# Patient Record
Sex: Female | Born: 1960 | Race: White | Hispanic: No | Marital: Married | Smoking: Never smoker
Health system: Southern US, Community
[De-identification: ages and names within clinical notes are randomized; demographics above are authoritative.]

## PROBLEM LIST (undated history)

## (undated) DIAGNOSIS — R000 Tachycardia, unspecified: Secondary | ICD-10-CM

## (undated) DIAGNOSIS — M8580 Other specified disorders of bone density and structure, unspecified site: Secondary | ICD-10-CM

## (undated) DIAGNOSIS — J302 Other seasonal allergic rhinitis: Secondary | ICD-10-CM

## (undated) DIAGNOSIS — C4491 Basal cell carcinoma of skin, unspecified: Secondary | ICD-10-CM

## (undated) DIAGNOSIS — G43009 Migraine without aura, not intractable, without status migrainosus: Secondary | ICD-10-CM

## (undated) HISTORY — DX: Other seasonal allergic rhinitis: J30.2

## (undated) HISTORY — DX: Basal cell carcinoma of skin, unspecified: C44.91

## (undated) HISTORY — PX: WISDOM TOOTH EXTRACTION: SHX21

## (undated) HISTORY — DX: Other specified disorders of bone density and structure, unspecified site: M85.80

## (undated) HISTORY — DX: Migraine without aura, not intractable, without status migrainosus: G43.009

## (undated) HISTORY — DX: Tachycardia, unspecified: R00.0

---
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
## 1998-06-24 DIAGNOSIS — R000 Tachycardia, unspecified: Secondary | ICD-10-CM

## 1998-06-24 HISTORY — DX: Tachycardia, unspecified: R00.0

## 1998-08-18 ENCOUNTER — Other Ambulatory Visit: Admission: RE | Admit: 1998-08-18 | Discharge: 1998-08-18 | Payer: Self-pay | Admitting: Obstetrics and Gynecology

## 2000-05-20 ENCOUNTER — Other Ambulatory Visit: Admission: RE | Admit: 2000-05-20 | Discharge: 2000-05-20 | Payer: Self-pay | Admitting: Obstetrics and Gynecology

## 2002-03-26 ENCOUNTER — Other Ambulatory Visit: Admission: RE | Admit: 2002-03-26 | Discharge: 2002-03-26 | Payer: Self-pay | Admitting: Obstetrics and Gynecology

## 2003-12-06 ENCOUNTER — Other Ambulatory Visit: Admission: RE | Admit: 2003-12-06 | Discharge: 2003-12-06 | Payer: Self-pay | Admitting: Obstetrics and Gynecology

## 2005-07-01 ENCOUNTER — Ambulatory Visit: Payer: Self-pay | Admitting: Family Medicine

## 2005-08-05 ENCOUNTER — Other Ambulatory Visit: Admission: RE | Admit: 2005-08-05 | Discharge: 2005-08-05 | Payer: Self-pay | Admitting: Obstetrics and Gynecology

## 2006-01-28 ENCOUNTER — Ambulatory Visit: Payer: Self-pay | Admitting: Family Medicine

## 2006-08-28 ENCOUNTER — Other Ambulatory Visit: Admission: RE | Admit: 2006-08-28 | Discharge: 2006-08-28 | Payer: Self-pay | Admitting: Obstetrics & Gynecology

## 2008-03-21 ENCOUNTER — Other Ambulatory Visit: Admission: RE | Admit: 2008-03-21 | Discharge: 2008-03-21 | Payer: Self-pay | Admitting: Obstetrics and Gynecology

## 2008-08-01 ENCOUNTER — Telehealth: Payer: Self-pay | Admitting: Family Medicine

## 2008-08-04 ENCOUNTER — Ambulatory Visit: Payer: Self-pay | Admitting: Family Medicine

## 2008-08-04 DIAGNOSIS — R002 Palpitations: Secondary | ICD-10-CM | POA: Insufficient documentation

## 2008-09-12 ENCOUNTER — Ambulatory Visit: Payer: Self-pay | Admitting: Family Medicine

## 2008-09-12 LAB — CONVERTED CEMR LAB
ALT: 17 units/L (ref 0–35)
Alkaline Phosphatase: 65 units/L (ref 39–117)
Basophils Relative: 0.4 % (ref 0.0–3.0)
Bilirubin Urine: NEGATIVE
Bilirubin, Direct: 0 mg/dL (ref 0.0–0.3)
Calcium: 9.5 mg/dL (ref 8.4–10.5)
Cholesterol: 210 mg/dL — ABNORMAL HIGH (ref 0–200)
Creatinine, Ser: 0.8 mg/dL (ref 0.4–1.2)
Eosinophils Absolute: 0.2 10*3/uL (ref 0.0–0.7)
Eosinophils Relative: 3.8 % (ref 0.0–5.0)
GFR calc non Af Amer: 81.65 mL/min (ref >60)
Ketones, urine, test strip: NEGATIVE
Lymphocytes Relative: 38.5 % (ref 12.0–46.0)
Monocytes Relative: 8.7 % (ref 3.0–12.0)
Neutrophils Relative %: 48.6 % (ref 43.0–77.0)
Nitrite: NEGATIVE
Protein, U semiquant: NEGATIVE
RBC: 4 M/uL (ref 3.87–5.11)
Total CHOL/HDL Ratio: 3
Total Protein: 7.1 g/dL (ref 6.0–8.3)
Triglycerides: 83 mg/dL (ref 0.0–149.0)
Urobilinogen, UA: 0.2
VLDL: 16.6 mg/dL (ref 0.0–40.0)
WBC: 4.6 10*3/uL (ref 4.5–10.5)

## 2008-09-19 ENCOUNTER — Ambulatory Visit: Payer: Self-pay | Admitting: Family Medicine

## 2008-09-19 DIAGNOSIS — G43009 Migraine without aura, not intractable, without status migrainosus: Secondary | ICD-10-CM | POA: Insufficient documentation

## 2008-10-24 ENCOUNTER — Ambulatory Visit: Payer: Self-pay | Admitting: Family Medicine

## 2008-10-24 DIAGNOSIS — M545 Low back pain, unspecified: Secondary | ICD-10-CM | POA: Insufficient documentation

## 2008-10-24 LAB — CONVERTED CEMR LAB
Glucose, Urine, Semiquant: NEGATIVE
Protein, U semiquant: NEGATIVE
WBC Urine, dipstick: NEGATIVE
pH: 7

## 2008-10-25 ENCOUNTER — Ambulatory Visit: Payer: Self-pay | Admitting: Internal Medicine

## 2008-10-25 ENCOUNTER — Ambulatory Visit: Payer: Self-pay | Admitting: Family Medicine

## 2008-10-25 ENCOUNTER — Telehealth: Payer: Self-pay | Admitting: Family Medicine

## 2008-10-25 DIAGNOSIS — N12 Tubulo-interstitial nephritis, not specified as acute or chronic: Secondary | ICD-10-CM | POA: Insufficient documentation

## 2008-10-28 ENCOUNTER — Ambulatory Visit: Payer: Self-pay | Admitting: Family Medicine

## 2009-04-24 ENCOUNTER — Encounter: Admission: RE | Admit: 2009-04-24 | Discharge: 2009-04-24 | Payer: Self-pay | Admitting: Obstetrics and Gynecology

## 2009-10-10 ENCOUNTER — Ambulatory Visit: Payer: Self-pay | Admitting: Family Medicine

## 2009-10-10 DIAGNOSIS — J45901 Unspecified asthma with (acute) exacerbation: Secondary | ICD-10-CM | POA: Insufficient documentation

## 2009-10-12 ENCOUNTER — Ambulatory Visit: Payer: Self-pay | Admitting: Family Medicine

## 2010-04-24 ENCOUNTER — Ambulatory Visit: Payer: Self-pay | Admitting: Family Medicine

## 2010-04-24 DIAGNOSIS — M549 Dorsalgia, unspecified: Secondary | ICD-10-CM | POA: Insufficient documentation

## 2010-04-24 HISTORY — DX: Dorsalgia, unspecified: M54.9

## 2010-04-26 ENCOUNTER — Ambulatory Visit: Payer: Self-pay | Admitting: Family Medicine

## 2010-05-07 ENCOUNTER — Encounter: Admission: RE | Admit: 2010-05-07 | Discharge: 2010-05-07 | Payer: Self-pay | Admitting: Obstetrics and Gynecology

## 2010-05-15 ENCOUNTER — Encounter: Admission: RE | Admit: 2010-05-15 | Discharge: 2010-05-15 | Payer: Self-pay | Admitting: Obstetrics and Gynecology

## 2010-07-24 NOTE — Assessment & Plan Note (Signed)
Summary: COUGH, CONGESTION // RS   Vital Signs:  Patient profile:   50 year old female Height:      64.5 inches Weight:      167 pounds BMI:     28.32 Temp:     98.8 degrees F oral BP sitting:   120 / 78  (left arm) Cuff size:   regular  Vitals Entered By: Kern Reap CMA Duncan Dull) (October 10, 2009 3:46 PM) CC: cough   CC:  cough.  History of Present Illness: Sydney Padilla is a 50 year old, married female, nonsmoker, who comes in today for evaluation of asthma,  She's never had any allergy problems, nor asthma, and her entire life.  For the past, month.  She's been cleaning out an aunt's house / a lot of dust and mold in the house.last Friday she began coughing and wheezing.  Allergies: No Known Drug Allergies  Past History:  Past medical, surgical, family and social histories (including risk factors) reviewed for relevance to current acute and chronic problems.  Family History: Reviewed history and no changes required.  Social History: Reviewed history from 08/04/2008 and no changes required. 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 No apparent alopecia or balding. Eyes:  No corneal or conjunctival inflammation noted. EOMI. Perrla. Funduscopic exam benign, without hemorrhages, exudates or papilledema. Vision grossly normal. Ears:  External ear exam shows no significant lesions or deformities.  Otoscopic examination reveals clear canals, tympanic membranes are intact bilaterally without bulging, retraction, inflammation or discharge. Hearing is grossly normal bilaterally. Nose:  External nasal examination shows no deformity or inflammation. Nasal mucosa are pink and moist without lesions or  exudates. Mouth:  Oral mucosa and oropharynx without lesions or exudates.  Teeth in good repair. Neck:  No deformities, masses, or tenderness noted. Chest Wall:  No deformities, masses, or tenderness noted. Lungs:  inspiratory and expiratory wheezing   Problems:  Medical Problems Added: 1)  Dx of Asthma, Acute  (WUX-324.40)  Impression & Recommendations:  Problem # 1:  ASTHMA, ACUTE (NUU-725.36) Assessment New  Her updated medication list for this problem includes:    Prednisone 20 Mg Tabs (Prednisone) ..... Uad    Flovent Hfa 44 Mcg/act Aero (Fluticasone propionate  hfa) .Marland Kitchen... 2 ps q 6 hr.  Orders: Prescription Created Electronically [phone]) Nebulizer [place]x (474[address]) HFA Instruction [phone])  Complete Medication List: 1)  Atenolol 50 Mg Tabs (Atenolol) .... Take half tab by mouth once daily 2)  Zomig Zmt 2.5 Mg Tbdp (Zolmitriptan) .... Take one tab as needed 3)  Ciprofloxacin Hcl 500 Mg Tabs (Ciprofloxacin hcl) .... Take 1 tablet by mouth two times a day 4)  Percocet 7.5-325 Mg Tabs (Oxycodone-acetaminophen) .... Take 1 tablet by mouth four times a day pain 5)  Hydromet 5-1.5 Mg/48ml Syrp (Hydrocodone-homatropine) .Marland Kitchen.. 1 or 2 tsps at bedtime as needed 6)  Promethazine Hcl 25 Mg Tabs (Promethazine hcl) .... Take 1 tablet by mouth three times a day 7)  Vicodin Es 7.5-750 Mg Tabs (Hydrocodone-acetaminophen) .... Take 1 tablet by mouth three times a day as needed migraine 8)  Prednisone 20 Mg Tabs (Prednisone) .... Uad 9)  Flovent Hfa 44 Mcg/act Aero (Fluticasone propionate  hfa) .... 2 ps q 6 hr. 10)  Hydromet 5-1.5 Mg/78ml Syrp (Hydrocodone-homatropine) .... 1/ to 1  tsp three times a day as needed  Patient Instructions: 1)  drink 30 ounces of water daily, run a vaporizer in your bedroom at night, began prednisone 3 tabs now then two tabs q.a.m. x 3 days, one x 3 days, a half x 3 days, then half a tablet Monday, Wednesday, Friday, for a 3-week taper. 2)  You may use albuterol one or two  puffs every 6 hours as needed for wheezing. 3)  Hydromet one half to 1 teaspoon 3 times a day as needed for cough. 4)  Return Thursday for follow-up Prescriptions: HYDROMET 5-1.5 MG/5ML SYRP (HYDROCODONE-HOMATROPINE) 1/ to 1 tsp three times a day as needed  #4oz x 1   Entered and Authorized by:   Roderick Pee MD   Signed by:   Roderick Pee MD on 10/10/2009   Method used:   Print then Give to Patient   RxID:   [id number] FLOVENT HFA 44 MCG/ACT AERO (FLUTICASONE PROPIONATE  HFA) 2 ps q 6 hr.  #1 x 1   Entered and Authorized by:   Roderick Pee MD   Signed by:   Roderick Pee MD on 10/10/2009   Method used:   Electronically to        [facility] # [id number]* (retail)       [address]       [place], [place]  [address]       Ph: [phone]       Fax: [phone]   RxID:   [id number] PREDNISONE 20 MG TABS (PREDNISONE) UAD  #50 x 1   Entered and Authorized by:   Roderick Pee MD   Signed by:   Roderick Pee MD on 10/10/2009   Method used:   Electronically to        [facility] # [phone]* (retail)       [address]       [place], [place]  [address]       Ph: [phone]       Fax: [phone]   RxID:   [id number]

## 2010-07-24 NOTE — Assessment & Plan Note (Signed)
Summary: 2 day rov/njr   Vital Signs:  Patient profile:   50 year old female Weight:      168 pounds Temp:     99.4 degrees F oral BP sitting:   110 / 70  (left arm) Cuff size:   regular  Vitals Entered By: Kern Reap CMA Duncan Dull) (April 26, 2010 4:03 PM) CC: follow-up visit   CC:  follow-up visit.  History of Present Illness: Sydney Padilla is a 50 year old female, married, nonsmoker comes back today for follow-up of back pain.  She says her pain now is down to a two or 3 and she feels much much better.  Allergies: No Known Drug Allergies  Past History:  Past medical, surgical, family and social histories (including risk factors) reviewed for relevance to current acute and chronic problems.  Family History: Reviewed history and no changes required.  Social History: Reviewed history from 08/04/2008 and no changes required. 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   Pulses:  R and L carotid,radial,femoral,dorsalis pedis and posterior tibial pulses are full and equal bilaterally Extremities:  No clubbing, cyanosis, edema, or deformity noted with normal full range of motion of all joints.   Neurologic:  No cranial nerve deficits noted. Station and gait are normal. Plantar reflexes are down-going bilaterally. DTRs are symmetrical throughout. Sensory, motor and coordinative functions appear intact.   Impression & Recommendations:  Problem # 1:  BACK PAIN (ICD-724.5) Assessment Improved  Her updated medication list for this problem includes:    Percocet 7.5-325 Mg Tabs (Oxycodone-acetaminophen) .Marland Kitchen... Take 1 tablet by mouth four times a day pain    Vicodin Es 7.5-750 Mg Tabs  (Hydrocodone-acetaminophen) .Marland Kitchen... Take 1 tablet by mouth three times a day as needed migraine    Flexeril 5 Mg Tabs (Cyclobenzaprine hcl) .Marland Kitchen... Take 1 tablet by mouth three times a day  Complete Medication List: 1)  Atenolol 50 Mg Tabs (Atenolol) .... Take half tab by mouth once daily 2)  Zomig Zmt 2.5 Mg Tbdp (Zolmitriptan) .... Take one tab as needed 3)  Percocet 7.5-325 Mg Tabs (Oxycodone-acetaminophen) .... Take 1 tablet by mouth four times a day pain 4)  Promethazine Hcl 25 Mg Tabs (Promethazine hcl) .... Take 1 tablet by mouth three times a day 5)  Vicodin Es 7.5-750 Mg Tabs (Hydrocodone-acetaminophen) .... Take 1 tablet by mouth three times a day as needed migraine 6)  Prednisone 20 Mg Tabs (Prednisone) .... Uad 7)  Flovent Hfa 44 Mcg/act Aero (Fluticasone propionate  hfa) .... 2 ps q 6 hr. 8)  Elestrin 0.52 Mg/0.87 Gm (0.06%) Gel (Estradiol) .... Use as directed 9)  Provera 10 Mg Tabs (Medroxyprogesterone acetate) .... Take one tab by mouth every 12 hours or as directed by your doctor 10)  Flexeril 5 Mg Tabs (Cyclobenzaprine hcl) .... Take 1 tablet by mouth three times a day  Patient Instructions: 1)  slowly taper the prednisone, take one tablet for 3 days, a half a tablet for 3 days, then stop. 2)  Take a half of a Flexeril and/or pain pill at bedtime as needed. 3)  Start on Saturday 15 minutes of back stretching exercises twice daily. 4)  Return p.r.n.   Orders Added: 1)  Est. Patient Level III [24401]

## 2010-07-24 NOTE — Assessment & Plan Note (Signed)
Summary: "blew back out"//lch   Vital Signs:  Patient profile:   50 year old female Weight:      168 pounds Temp:     98.2 degrees F oral BP sitting:   120 / 78  (left arm) Cuff size:   regular  Vitals Entered By: Kern Reap CMA Duncan Dull) (April 24, 2010 12:22 PM) CC: lower back pain   CC:  lower back pain.  History of Present Illness: Sydney Padilla is a 50 year old, married female, nonsmoker, who comes in today with severe back pain.  She states her back pain started about 5 weeks ago.  No history of trauma.  She stops when he went to a chiropractor.  They x-rayed her back, which she states was normal and were giving her chiropractic treatments.  Things seem to be better until Saturday when all of a sudden her pain became worse.  She denies any neurologic symptoms.  Again, no history of trauma, and no history of previous back problems in the past.  Review of systems negative except for she is currently on Provera every 12 hours because of breakthrough.  Perimenopausal bleeding  Allergies: No Known Drug Allergies  Past History:  Past medical, surgical, family and social histories (including risk factors) reviewed for relevance to current acute and chronic problems.  Family History: Reviewed history and no changes required.  Social History: Reviewed history from 08/04/2008 and no changes required. 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   Pulses:  R and L carotid,radial,femoral,dorsalis pedis and posterior tibial pulses are full and equal bilaterally Extremities:  No clubbing, cyanosis, edema, or deformity noted with normal full range of motion of all joints.   Neurologic:  No cranial nerve  deficits noted. Station and gait are normal. Plantar reflexes are down-going bilaterally. DTRs are symmetrical throughout. Sensory, motor and coordinative functions appear intact.   Problems:  Medical Problems Added: 1)  Dx of Back Pain  (ICD-724.5)  Impression & Recommendations:  Problem # 1:  BACK PAIN (ICD-724.5) Assessment New  Her updated medication list for this problem includes:    Percocet 7.5-325 Mg Tabs (Oxycodone-acetaminophen) .Marland Kitchen... Take 1 tablet by mouth four times a day pain    Vicodin Es 7.5-750 Mg Tabs (Hydrocodone-acetaminophen) .Marland Kitchen... Take 1 tablet by mouth three times a day as needed migraine    Flexeril 5 Mg Tabs (Cyclobenzaprine hcl) .Marland Kitchen... Take 1 tablet by mouth three times a day  Complete Medication List: 1)  Atenolol 50 Mg Tabs (Atenolol) .... Take half tab by mouth once daily 2)  Zomig Zmt 2.5 Mg Tbdp (Zolmitriptan) .... Take one tab as needed 3)  Percocet 7.5-325 Mg Tabs (Oxycodone-acetaminophen) .... Take 1 tablet by mouth four times a day pain 4)  Promethazine Hcl 25 Mg Tabs (Promethazine hcl) .... Take 1 tablet by mouth three times a day 5)  Vicodin Es 7.5-750 Mg Tabs (Hydrocodone-acetaminophen) .... Take 1 tablet by mouth three times a day as needed migraine 6)  Prednisone 20 Mg Tabs (Prednisone) .... Uad 7)  Flovent Hfa 44 Mcg/act Aero (Fluticasone propionate  hfa) .... 2 ps q 6 hr. 8)  Elestrin 0.52 Mg/0.87 Gm (0.06%) Gel (Estradiol) .... Use as directed 9)  Provera 10 Mg Tabs (Medroxyprogesterone acetate) .... Take one  tab by mouth every 12 hours or as directed by your doctor 10)  Flexeril 5 Mg Tabs (Cyclobenzaprine hcl) .... Take 1 tablet by mouth three times a day  Patient Instructions: 1)  bedrest at home. 2)  Flexeril and Percocet 3 times a day. 3)  Milk of Magnesia  to prevent constipation 4)  Prednisone two tabs daily. 5)  Return Thursday afternoon for follow-up Prescriptions: PREDNISONE 20 MG TABS (PREDNISONE) UAD  #30 x 1   Entered and  Authorized by:   Roderick Pee MD   Signed by:   Roderick Pee MD on 04/24/2010   Method used:   Print then Give to Patient   RxID:   [id number] PERCOCET 7.5-325 MG TABS (OXYCODONE-ACETAMINOPHEN) Take 1 tablet by mouth four times a day pain  #40 x 0   Entered and Authorized by:   Roderick Pee MD   Signed by:   Roderick Pee MD on 04/24/2010   Method used:   Print then Give to Patient   RxID:   [id number] FLEXERIL 5 MG TABS (CYCLOBENZAPRINE HCL) Take 1 tablet by mouth three times a day  #30 x 1   Entered and Authorized by:   Roderick Pee MD   Signed by:   Roderick Pee MD on 04/24/2010   Method used:   Print then Give to Patient   RxID:   [id number]    Orders Added: 1)  Est. Patient Level IV [01027]

## 2010-07-24 NOTE — Assessment & Plan Note (Signed)
Summary: 2 day rov/njr   Vital Signs:  Patient profile:   50 year old female Temp:     98.7 degrees F oral BP sitting:   110 / 80  (left arm) Cuff size:   regular  Vitals Entered By: Kern Reap CMA Duncan Dull) (October 12, 2009 3:43 PM)  History of Present Illness: Hiliary is a 50 year old, married female, nonsmoker who comes in today for follow-up of asthma.  She was started on prednisone and albuterol on for 19 because of severe asthma.  She comes back today for follow up feeling much better.  No side effects from medication  Allergies: No Known Drug Allergies  Past History:  Past medical, surgical, family and social histories (including risk factors) reviewed for relevance to current acute and chronic problems.  Family History: Reviewed history and no changes required.  Social History: Reviewed history from 08/04/2008 and no changes required. 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 Lungs:  increase breath sounds decreased wheezing, but still present   Impression & Recommendations:  Problem # 1:  ASTHMA, ACUTE (ICD-493.92) Assessment Improved  Her updated medication list for this problem includes:    Prednisone 20 Mg Tabs (Prednisone) ..... Uad    Flovent Hfa 44 Mcg/act Aero (Fluticasone propionate  hfa) .Marland Kitchen... 2 ps q 6 hr.  Complete Medication List: 1)  Atenolol 50 Mg Tabs (Atenolol) .... Take half tab by mouth once daily 2)  Zomig Zmt 2.5 Mg Tbdp (Zolmitriptan) .... Take one tab as needed 3)  Ciprofloxacin Hcl 500 Mg Tabs (Ciprofloxacin hcl) .... Take 1 tablet by mouth two times a day 4)  Percocet 7.5-325 Mg Tabs (Oxycodone-acetaminophen) .... Take 1 tablet by mouth four times a day pain 5)   Promethazine Hcl 25 Mg Tabs (Promethazine hcl) .... Take 1 tablet by mouth three times a day 6)  Vicodin Es 7.5-750 Mg Tabs (Hydrocodone-acetaminophen) .... Take 1 tablet by mouth three times a day as needed migraine 7)  Prednisone 20 Mg Tabs (Prednisone) .... Uad 8)  Flovent Hfa 44 Mcg/act Aero (Fluticasone propionate  hfa) .... 2 ps q 6 hr. 9)  Hydromet 5-1.5 Mg/8ml Syrp (Hydrocodone-homatropine) .... 1/ to 1 tsp three times a day as needed 10)  Estoril  .... Use as directed  Patient Instructions: 1)  take to prednisone tablets, Friday, Saturday, Sunday, Monday, begin to taper by taking one tablet for 3 days, a half for 3 days, then half a tablet Monday, Wednesday, Friday, for a two week taper.  Stop the inhaled medication. 2)  Return this summer for y annual  , your exam

## 2011-07-26 ENCOUNTER — Other Ambulatory Visit: Payer: Self-pay | Admitting: Obstetrics and Gynecology

## 2011-07-26 DIAGNOSIS — Z1231 Encounter for screening mammogram for malignant neoplasm of breast: Secondary | ICD-10-CM

## 2011-08-15 ENCOUNTER — Ambulatory Visit
Admission: RE | Admit: 2011-08-15 | Discharge: 2011-08-15 | Disposition: A | Discharge status: Home / Self Care | Payer: 59 | Source: Ambulatory Visit | Attending: Obstetrics and Gynecology | Admitting: Obstetrics and Gynecology

## 2011-08-15 DIAGNOSIS — Z1231 Encounter for screening mammogram for malignant neoplasm of breast: Secondary | ICD-10-CM

## 2012-06-30 ENCOUNTER — Other Ambulatory Visit: Payer: Self-pay | Admitting: Obstetrics and Gynecology

## 2012-06-30 DIAGNOSIS — Z1231 Encounter for screening mammogram for malignant neoplasm of breast: Secondary | ICD-10-CM

## 2012-07-01 LAB — HM PAP SMEAR: HM PAP: NEGATIVE

## 2012-08-17 ENCOUNTER — Ambulatory Visit: Payer: 59

## 2012-09-09 ENCOUNTER — Other Ambulatory Visit: Payer: Self-pay | Admitting: Nurse Practitioner

## 2012-09-10 ENCOUNTER — Ambulatory Visit
Admission: RE | Admit: 2012-09-10 | Discharge: 2012-09-10 | Disposition: A | Discharge status: Home / Self Care | Payer: 59 | Source: Ambulatory Visit | Attending: Obstetrics and Gynecology | Admitting: Obstetrics and Gynecology

## 2012-09-10 DIAGNOSIS — Z1231 Encounter for screening mammogram for malignant neoplasm of breast: Secondary | ICD-10-CM

## 2013-06-21 ENCOUNTER — Other Ambulatory Visit: Payer: Self-pay | Admitting: *Deleted

## 2013-06-21 MED ORDER — ESTRADIOL 1 MG PO TABS: 1.0000 mg | ORAL_TABLET | Freq: Every day | ORAL | Status: DC

## 2013-06-21 NOTE — Telephone Encounter (Signed)
eScribe request for refill on ESTRADIOL Last filled - 07/01/12 X 1 YEAR Last AEX - 07/01/12 Next AEX - not scheduled. Last MMG - 09/11/12, normal  I spoke to patient. AEX scheduled for 07/05/13.  One month supply sent to pharmacy.

## 2013-07-02 ENCOUNTER — Encounter: Payer: Self-pay | Admitting: Nurse Practitioner

## 2013-07-05 ENCOUNTER — Encounter: Payer: Self-pay | Admitting: Nurse Practitioner

## 2013-07-05 ENCOUNTER — Ambulatory Visit (INDEPENDENT_AMBULATORY_CARE_PROVIDER_SITE_OTHER): Payer: 59 | Admitting: Nurse Practitioner

## 2013-07-05 VITALS — BP 110/66 | HR 64 | Ht 64.25 in | Wt 167.0 lb

## 2013-07-05 DIAGNOSIS — Z01419 Encounter for gynecological examination (general) (routine) without abnormal findings: Secondary | ICD-10-CM

## 2013-07-05 MED ORDER — ZOLMITRIPTAN 2.5 MG PO TBDP: 2.5000 mg | ORAL_TABLET | ORAL | Status: DC | PRN

## 2013-07-05 MED ORDER — ESTRADIOL 1 MG PO TABS: 1.0000 mg | ORAL_TABLET | Freq: Every day | ORAL | Status: DC

## 2013-07-05 MED ORDER — PROGESTERONE MICRONIZED 100 MG PO CAPS: 100.0000 mg | ORAL_CAPSULE | Freq: Every day | ORAL | Status: DC

## 2013-07-05 NOTE — Progress Notes (Signed)
Patient ID: Sydney Padilla, female   DOB: [birth year], 53 y.o.   MRN: [id number] 53 y.o. G0P0 Married Caucasian Fe here for annual exam.  Since change of Estrogen to 1 mg and Prometrium 100 mg last year no further vaginal spotting monthly.  Since then only 1 episode of vaginal bleeding 7-9 months ago that lasted 5-6 days like a regular menses.  No bleeding since.  Last PUS at [facility], Dr. Pamelia Hoit was in 2012 and was normal.  No LMP recorded. Patient is postmenopausal.          Sexually active: yes  The current method of family planning is post menopausal status.    Exercising: yes  Home exercise routine includes walking 2.5 miles 5 days per week. Smoker:  no  Health Maintenance: Pap:  07/01/12, WNL, neg HR HPV MMG:  09/10/12, Bi-Rads 1: negative Colonoscopy:  Never  BMD:  Never TDaP:  2005 Labs:  03/2012, PCP   reports that she has never smoked. She has never used smokeless tobacco. She reports that she drinks alcohol. She reports that she does not use illicit drugs.  Past Medical History  Diagnosis Date  . Migraines     History reviewed. No pertinent past surgical history.  Current Outpatient Prescriptions  Medication Sig Dispense Refill  . atenolol (TENORMIN) 25 MG tablet Take 25 mg by mouth as needed (heart rate).      Marland Kitchen estradiol (ESTRACE) 1 MG tablet Take 1 tablet (1 mg total) by mouth daily.  30 tablet  0  . progesterone (PROMETRIUM) 100 MG capsule Take 1 capsule by mouth daily.      Marland Kitchen zolmitriptan (ZOMIG-ZMT) 2.5 MG disintegrating tablet Take 1 tablet by mouth as needed. For headaches       No current facility-administered medications for this visit.    Family History  Problem Relation Age of Onset  . Hypertension Mother   . Osteoarthritis Mother   . Heart disease Father     Psychologist, forensic  . Diabetes Maternal Grandmother   . Hypertension Maternal Grandmother   . Heart disease Maternal Grandfather   . Cancer Other     uterine    ROS:  Pertinent  items are noted in HPI.  Otherwise, a comprehensive ROS was negative.  Exam:   BP 110/66  Pulse 64  Ht 5' 4.25" (1.632 m)  Wt 167 lb (75.751 kg)  BMI 28.44 kg/m2 Height: 5' 4.25" (163.2 cm)  Ht Readings from Last 3 Encounters:  07/05/13 5' 4.25" (1.632 m)  10/10/09 5' 4.5" (1.638 m)  09/19/08 5' 4.5" (1.638 m)    General appearance: alert, cooperative and appears stated age Head: Normocephalic, without obvious abnormality, atraumatic Neck: no adenopathy, supple, symmetrical, trachea midline and thyroid normal to inspection and palpation Lungs: clear to auscultation bilaterally Breasts: normal appearance, no masses or tenderness Heart: regular rate and rhythm Abdomen: soft, non-tender; no masses,  no organomegaly Extremities: extremities normal, atraumatic, no cyanosis or edema Skin: Skin color, texture, turgor normal. No rashes or lesions Lymph nodes: Cervical, supraclavicular, and axillary nodes normal. No abnormal inguinal nodes palpated Neurologic: Grossly normal   Pelvic: External genitalia:  no lesions              Urethra:  normal appearing urethra with no masses, tenderness or lesions              Bartholin's and Skene's: normal  Vagina: normal appearing vagina with normal color and discharge, no lesions              Cervix: anteverted              Pap taken: no Bimanual Exam:  Uterus:  normal size, contour, position, consistency, mobility, non-tender              Adnexa: no mass, fullness, tenderness               Rectovaginal: Confirms               Anus:  normal sphincter tone, no lesions  A:  Well Woman with normal exam  Postmenopausal on HRT 2012 to present  History of migraine headaches - less now    P:   Pap smear as per guidelines   Mammogram due 3/15  Refilled Zomig for a year  Refilled Estradiol and Prometrium for a year = she is going to try and taper down to 0.5 mg daily.  She wants to try and reduce emotional and bloating symptoms.  If  any vaginal bleeding to CB.  Counseled about risk of HRT with CVA, DVT, cancer, etc  Counseled on breast self exam, mammography screening, use and side effects of HRT, adequate intake of calcium and vitamin D, diet and exercise, Kegel's exercises return annually or prn  An After Visit Summary was printed and given to the patient.

## 2013-07-05 NOTE — Patient Instructions (Addendum)

## 2013-07-08 NOTE — Progress Notes (Signed)
Encounter reviewed by Dr. Anice Wilshire Silva.  

## 2013-07-27 ENCOUNTER — Other Ambulatory Visit: Payer: Self-pay

## 2013-07-27 DIAGNOSIS — Z1231 Encounter for screening mammogram for malignant neoplasm of breast: Secondary | ICD-10-CM

## 2013-09-20 ENCOUNTER — Ambulatory Visit: Payer: 59

## 2013-09-28 ENCOUNTER — Ambulatory Visit
Admission: RE | Admit: 2013-09-28 | Discharge: 2013-09-28 | Disposition: A | Discharge status: Home / Self Care | Payer: 59 | Source: Ambulatory Visit

## 2013-09-28 DIAGNOSIS — Z1231 Encounter for screening mammogram for malignant neoplasm of breast: Secondary | ICD-10-CM

## 2013-12-22 ENCOUNTER — Telehealth: Payer: Self-pay

## 2013-12-22 NOTE — Telephone Encounter (Signed)
Pt was last seen for AEX on 07/05/13 Pt has enough refills to last until Sept, 2015 Pharm request duplicate Encounter closed

## 2013-12-23 NOTE — Telephone Encounter (Signed)
Last AEX:07/05/13 Pt has enough refills until Next AEX, 2016 Avoid last telephone encounter  Encounter closed

## 2014-03-24 ENCOUNTER — Telehealth: Payer: Self-pay | Admitting: Family Medicine

## 2014-03-24 MED ORDER — HYDROCODONE-HOMATROPINE 5-1.5 MG/5ML PO SYRP: ORAL_SOLUTION | ORAL | Status: DC

## 2014-03-24 NOTE — Telephone Encounter (Signed)
Patient Information:  Caller Name: Truc  Phone: [phone]  Patient: Sydney Padilla, Sydney Padilla  Gender: Female  DOB: [birth year]  Age: 53 Years  PCP: Stevie Kern [facility])  Pregnant: No  Office Follow Up:  Does the office need to follow up with this patient?: Yes  Instructions For The Office: Pt requests a callback.  RN Note:  Asking for Hydromet cough syrup. Had some from 3 yrs ago and worked great. Now out. Pt requests a call back to let her know if Hydromet or Tessalon Pearles may be called in. Pharmacy on file.  Symptoms  Reason For Call & Symptoms: Pt calling regarding ongoing cough after head cold. Get's worse with lying down. Clear drainage. ADL normal. Cough keeping her up at night.  Reviewed Health History In EMR: Yes  Reviewed Medications In EMR: Yes  Reviewed Allergies In EMR: Yes  Reviewed Surgeries / Procedures: Yes  Date of Onset of Symptoms: 03/18/2014  Treatments Tried: OTC cough med, Robitussin per  Treatments Tried Worked: No OB / GYN:  LMP: Unknown  Guideline(s) Used:  Cough  Disposition Per Guideline:   See Today or Tomorrow in Office  Reason For Disposition Reached:   Continuous (nonstop) coughing interferes with work or school and no improvement using cough treatment per Care Advice  Advice Given:  Reassurance  Coughing is the way that our lungs remove irritants and mucus. It helps protect our lungs from getting pneumonia.  You can get a dry hacking cough after a chest cold. Sometimes this type of cough can last 1-3 weeks, and be worse at night.  You can also get a cough after being exposed to irritating substances like smoke, strong perfumes, and dust.  Here is some care advice that should help.  Cough Medicines:  OTC Cough Syrups: The most common cough suppressant in OTC cough medications is dextromethorphan. Often the letters "DM" appear in the name.  OTC Cough Drops: Cough drops can help a lot, especially for mild coughs. They reduce  coughing by soothing your irritated throat and removing that tickle sensation in the back of the throat. Cough drops also have the advantage of portability - you can carry them with you.  Home Remedy - Honey: This old home remedy has been shown to help decrease coughing at night. The adult dosage is 2 teaspoons (10 ml) at bedtime. Honey should not be given to infants under one year of age.  OTC Cough Syrup - Dextromethorphan:  Cough syrups containing the cough suppressant dextromethorphan (DM) may help decrease your cough. Cough syrups work best for coughs that keep you awake at night. They can also sometimes help in the late stages of a respiratory infection when the cough is dry and hacking. They can be used along with cough drops.  Examples: Benylin, Robitussin DM, Vicks 44 Cough Relief  Read the package instructions for dosage, contraindications, and other important information.  Caution - Dextromethorphan:   Do not try to completely suppress coughs that produce mucus and phlegm. Remember that coughing is helpful in bringing up mucus from the lungs and preventing pneumonia.  Coughing Spasms:  Drink warm fluids. Inhale warm mist (Reason: both relax the airway and loosen up the phlegm).  Suck on cough drops or hard candy to coat the irritated throat.  Prevent Dehydration:  Drink adequate liquids.  This will help soothe an irritated or dry throat and loosen up the phlegm.  Expected Course:   The expected course depends on what is causing the cough.  Viral bronchitis (chest cold) causes a cough that lasts 1 to 3 weeks. Sometimes you may cough up lots of phlegm (sputum, mucus). The mucus can normally be white, gray, yellow, or green.  Call Back If:  Difficulty breathing  Cough lasts more than 3 weeks  Fever lasts > 3 days  You become worse.  Patient Will Follow Care Advice:  YES

## 2014-03-24 NOTE — Telephone Encounter (Signed)
Rx per Dr Sherren Mocha.  Ready for pick up and patient is aware.

## 2014-04-07 ENCOUNTER — Telehealth: Payer: Self-pay | Admitting: Family Medicine

## 2014-04-07 NOTE — Telephone Encounter (Signed)
Pt not seen in way over 3 yrs. Called to get a cpe.  Will you accept pt back and do her cpe? pls advise.

## 2014-04-07 NOTE — Telephone Encounter (Signed)
Okay to schedule per Dr Sherren Mocha

## 2014-04-08 NOTE — Telephone Encounter (Signed)
Lm on vm to cb and sch appt °

## 2014-04-13 NOTE — Telephone Encounter (Signed)
Pt scheduled  

## 2014-05-12 ENCOUNTER — Other Ambulatory Visit (INDEPENDENT_AMBULATORY_CARE_PROVIDER_SITE_OTHER): Payer: 59

## 2014-05-12 DIAGNOSIS — Z0000 Encounter for general adult medical examination without abnormal findings: Secondary | ICD-10-CM

## 2014-05-12 LAB — CBC WITH DIFFERENTIAL/PLATELET
Basophils Absolute: 0 10*3/uL (ref 0.0–0.1)
Basophils Relative: 0.3 % (ref 0.0–3.0)
Eosinophils Absolute: 0.1 10*3/uL (ref 0.0–0.7)
Eosinophils Relative: 2.7 % (ref 0.0–5.0)
HCT: 38.9 % (ref 36.0–46.0)
Hemoglobin: 13.1 g/dL (ref 12.0–15.0)
LYMPHS ABS: 2.1 10*3/uL (ref 0.7–4.0)
LYMPHS PCT: 39.8 % (ref 12.0–46.0)
MCHC: 33.5 g/dL (ref 30.0–36.0)
MCV: 90.3 fl (ref 78.0–100.0)
MONO ABS: 0.5 10*3/uL (ref 0.1–1.0)
MONOS PCT: 8.9 % (ref 3.0–12.0)
NEUTROS ABS: 2.6 10*3/uL (ref 1.4–7.7)
NEUTROS PCT: 48.3 % (ref 43.0–77.0)
Platelets: 234 10*3/uL (ref 150.0–400.0)
RBC: 4.31 Mil/uL (ref 3.87–5.11)
RDW: 13.3 % (ref 11.5–15.5)
WBC: 5.4 10*3/uL (ref 4.0–10.5)

## 2014-05-12 LAB — POCT URINALYSIS DIPSTICK
Bilirubin, UA: NEGATIVE
Glucose, UA: NEGATIVE
Ketones, UA: NEGATIVE
LEUKOCYTES UA: NEGATIVE
NITRITE UA: NEGATIVE
PH UA: 7
PROTEIN UA: NEGATIVE
Spec Grav, UA: 1.01
Urobilinogen, UA: 0.2

## 2014-05-12 LAB — HEPATIC FUNCTION PANEL
ALBUMIN: 4.3 g/dL (ref 3.5–5.2)
ALT: 17 U/L (ref 0–35)
AST: 17 U/L (ref 0–37)
Alkaline Phosphatase: 69 U/L (ref 39–117)
Bilirubin, Direct: 0 mg/dL (ref 0.0–0.3)
TOTAL PROTEIN: 7.2 g/dL (ref 6.0–8.3)
Total Bilirubin: 0.7 mg/dL (ref 0.2–1.2)

## 2014-05-12 LAB — TSH: TSH: 1.02 u[IU]/mL (ref 0.35–4.50)

## 2014-05-12 LAB — BASIC METABOLIC PANEL
BUN: 19 mg/dL (ref 6–23)
CALCIUM: 9.3 mg/dL (ref 8.4–10.5)
CO2: 27 meq/L (ref 19–32)
CREATININE: 0.8 mg/dL (ref 0.4–1.2)
Chloride: 103 mEq/L (ref 96–112)
GFR: 84.66 mL/min (ref >60.00)
GLUCOSE: 93 mg/dL (ref 70–99)
Potassium: 4.4 mEq/L (ref 3.5–5.1)
SODIUM: 137 meq/L (ref 135–145)

## 2014-05-12 LAB — LIPID PANEL
CHOL/HDL RATIO: 2
Cholesterol: 204 mg/dL — ABNORMAL HIGH (ref 0–200)
HDL: 87.1 mg/dL (ref >39.00)
LDL Cholesterol: 101 mg/dL — ABNORMAL HIGH (ref 0–99)
NONHDL: 116.9
Triglycerides: 80 mg/dL (ref 0.0–149.0)
VLDL: 16 mg/dL (ref 0.0–40.0)

## 2014-05-23 ENCOUNTER — Encounter: Payer: Self-pay | Admitting: Family Medicine

## 2014-05-23 ENCOUNTER — Ambulatory Visit (INDEPENDENT_AMBULATORY_CARE_PROVIDER_SITE_OTHER): Payer: 59 | Admitting: Family Medicine

## 2014-05-23 VITALS — BP 110/70 | Temp 98.3°F | Ht 64.0 in | Wt 171.0 lb

## 2014-05-23 DIAGNOSIS — M545 Low back pain: Secondary | ICD-10-CM

## 2014-05-23 DIAGNOSIS — Z0000 Encounter for general adult medical examination without abnormal findings: Secondary | ICD-10-CM | POA: Insufficient documentation

## 2014-05-23 MED ORDER — ATENOLOL 25 MG PO TABS: 25.0000 mg | ORAL_TABLET | ORAL | Status: DC | PRN

## 2014-05-23 MED ORDER — ZOLMITRIPTAN 2.5 MG PO TBDP: 2.5000 mg | ORAL_TABLET | ORAL | Status: DC | PRN

## 2014-05-23 MED ORDER — FLUTICASONE PROPIONATE 50 MCG/ACT NA SUSP: 2.0000 | Freq: Every day | NASAL | Status: DC

## 2014-05-23 MED ORDER — CYCLOBENZAPRINE HCL 5 MG PO TABS: 5.0000 mg | ORAL_TABLET | Freq: Three times a day (TID) | ORAL | Status: DC | PRN

## 2014-05-23 NOTE — Progress Notes (Signed)
   Subjective:    Patient ID: Sydney Padilla, female    DOB: [birth year], 53 y.o.   MRN: [id number]  HPI Sydney Padilla is a 53 year old married female nonsmoker G0 P0..... Who comes in today for general physical examination  She gets routine eye care, dental care, BSE monthly, and you mammography, yearly Paps by GYN. She's been on HRT for 5 years since she had her last period we discussed the pluses and minuses of HRT long-term she will discuss this with her GYN  Declines a flu shot   Review of Systems  Constitutional: Negative.   HENT: Negative.   Eyes: Negative.   Respiratory: Negative.   Cardiovascular: Negative.   Gastrointestinal: Negative.   Endocrine: Negative.   Genitourinary: Negative.   Musculoskeletal: Negative.   Skin: Negative.   Allergic/Immunologic: Negative.   Neurological: Negative.   Hematological: Negative.   Psychiatric/Behavioral: Negative.        Objective:   Physical Exam  Constitutional: She appears well-developed and well-nourished.  HENT:  Head: Normocephalic and atraumatic.  Right Ear: External ear normal.  Left Ear: External ear normal.  Nose: Nose normal.  Mouth/Throat: Oropharynx is clear and moist.  Eyes: EOM are normal. Pupils are equal, round, and reactive to light.  Neck: Normal range of motion. Neck supple. No JVD present. No tracheal deviation present. No thyromegaly present.  Cardiovascular: Normal rate, regular rhythm, normal heart sounds and intact distal pulses.  Exam reveals no gallop and no friction rub.   No murmur heard. Pulmonary/Chest: Effort normal and breath sounds normal. No stridor. No respiratory distress. She has no wheezes. She has no rales. She exhibits no tenderness.  Abdominal: Soft. Bowel sounds are normal. She exhibits no distension and no mass. There is no tenderness. There is no rebound and no guarding.  Genitourinary:  Bilateral breast exam normal  Musculoskeletal: Normal range of motion.  Lymphadenopathy:   She has no cervical adenopathy.  Neurological: She is alert. She has normal reflexes. No cranial nerve deficit. She exhibits normal muscle tone. Coordination normal.  Skin: Skin is warm and dry. No rash noted. No erythema. No pallor.  Total body skin exam normal  Psychiatric: She has a normal mood and affect. Her behavior is normal. Judgment and thought content normal.  Nursing note and vitals reviewed.         Assessment & Plan:  Healthy female  History of low back pain..... Refill Flexeril and tramadol  Postmenopausal....... HRT view GYN....... 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

## 2014-05-23 NOTE — Progress Notes (Signed)
Pre visit review using our clinic review tool, if applicable. No additional management support is needed unless otherwise documented below in the visit note. 

## 2014-05-23 NOTE — Patient Instructions (Signed)
Some a when necessary for migraine headache  Flexeril 5 mg.......Marland Kitchen 13 times daily when necessary for low back pain  Continue Tenormin 25 mg one tablet daily  Steroid nasal spray when necessary  Return in one year sooner if any problem

## 2014-08-01 ENCOUNTER — Other Ambulatory Visit: Payer: Self-pay | Admitting: Nurse Practitioner

## 2014-08-01 MED ORDER — ESTRADIOL 1 MG PO TABS: 1.0000 mg | ORAL_TABLET | Freq: Every day | ORAL | Status: DC

## 2014-08-01 NOTE — Telephone Encounter (Signed)
Medication refill request: Progesterone 100 mg and Estrace 1 mg Last AEX:  07/05/13 PG Next AEX: 08/30/14 Last MMG (if hormonal medication request): 09/28/13 [id number]:Neg Refill authorized: 07/05/13 #90/3R. Both Today # 30/0R?

## 2014-08-30 ENCOUNTER — Encounter: Payer: Self-pay | Admitting: Nurse Practitioner

## 2014-08-30 ENCOUNTER — Ambulatory Visit (INDEPENDENT_AMBULATORY_CARE_PROVIDER_SITE_OTHER): Payer: 59 | Admitting: Nurse Practitioner

## 2014-08-30 VITALS — BP 110/66 | HR 64 | Ht 64.25 in | Wt 173.0 lb

## 2014-08-30 DIAGNOSIS — Z01419 Encounter for gynecological examination (general) (routine) without abnormal findings: Secondary | ICD-10-CM

## 2014-08-30 DIAGNOSIS — Z1211 Encounter for screening for malignant neoplasm of colon: Secondary | ICD-10-CM

## 2014-08-30 MED ORDER — ESTRADIOL 0.5 MG PO TABS: 0.5000 mg | ORAL_TABLET | Freq: Every day | ORAL | Status: DC

## 2014-08-30 MED ORDER — PROGESTERONE MICRONIZED 100 MG PO CAPS: ORAL_CAPSULE | ORAL | Status: DC

## 2014-08-30 NOTE — Progress Notes (Signed)
Patient ID: Sydney Padilla, female   DOB: 04-11-1961, 54 y.o.   MRN: [id number] 54 y.o. G0P0 Married  Caucasian Fe here for annual exam. Last fall took a trip to [place].  No further vaginal bleeding since 12/13.  Wants to continue on HRT - she is now cutting 1 mg tab in half.  Still getting vaso symptoms especially at hs.  No vaginal dryness.  Patient's last menstrual period was 05/24/2012.          Sexually active: Yes.    The current method of family planning is post menopausal status.    Exercising: Yes.    walking 10,000 steps per day and yoga 1-2 times per week Smoker:  no  Health Maintenance: Pap:  07/01/12, negative with neg HR HPV MMG:  09/28/13, 3D, Bi-Rads 1:  Negative` Colonoscopy:  Never  BMD:   05/15/10, T-Score -0.3S/-0.8L TDaP:  05/23/05  Labs:  PCP in EPIC   reports that she has never smoked. She has never used smokeless tobacco. She reports that she drinks alcohol. She reports that she does not use illicit drugs.  Past Medical History  Diagnosis Date  . Migraines   . Tachycardia 2000    occasional use of Atenolo    Past Surgical History  Procedure Laterality Date  . Wisdom tooth extraction  age 74    Current Outpatient Prescriptions  Medication Sig Dispense Refill  . atenolol (TENORMIN) 25 MG tablet Take 1 tablet (25 mg total) by mouth as needed (heart rate). 100 tablet 3  . cyclobenzaprine (FLEXERIL) 5 MG tablet Take 1 tablet (5 mg total) by mouth 3 (three) times daily as needed for muscle spasms. 30 tablet 2  . fluticasone (FLONASE) 50 MCG/ACT nasal spray Place 2 sprays into both nostrils daily. 16 g 6  . progesterone (PROMETRIUM) 100 MG capsule TAKE 1 CAPSULE (100 MG TOTAL) BY MOUTH DAILY. 90 capsule 3  . zolmitriptan (ZOMIG-ZMT) 2.5 MG disintegrating tablet Take 1 tablet (2.5 mg total) by mouth as needed. For headaches 6 tablet 12  . estradiol (ESTRACE) 0.5 MG tablet Take 1 tablet (0.5 mg total) by mouth daily. 90 tablet 3   No current  facility-administered medications for this visit.    Family History  Problem Relation Age of Onset  . Hypertension Mother   . Osteoarthritis Mother   . Heart disease Father     Psychologist, forensic  . Diabetes Maternal Grandmother   . Hypertension Maternal Grandmother   . Heart disease Maternal Grandfather   . Cancer Other     uterine    ROS:  Pertinent items are noted in HPI.  Otherwise, a comprehensive ROS was negative.  Exam:   BP 110/66 mmHg  Pulse 64  Ht 5' 4.25" (1.632 m)  Wt 173 lb (78.472 kg)  BMI 29.46 kg/m2  LMP 05/24/2012 Height: 5' 4.25" (163.2 cm) Ht Readings from Last 3 Encounters:  08/30/14 5' 4.25" (1.632 m)  05/23/14 5\' 4"  (1.626 m)  07/05/13 5' 4.25" (1.632 m)    General appearance: alert, cooperative and appears stated age Head: Normocephalic, without obvious abnormality, atraumatic Neck: no adenopathy, supple, symmetrical, trachea midline and thyroid normal to inspection and palpation Lungs: clear to auscultation bilaterally Breasts: normal appearance, no masses or tenderness Heart: regular rate and rhythm Abdomen: soft, non-tender; no masses,  no organomegaly Extremities: extremities normal, atraumatic, no cyanosis or edema Skin: Skin color, texture, turgor normal. No rashes or lesions Lymph nodes: Cervical, supraclavicular, and axillary nodes normal. No abnormal inguinal  nodes palpated Neurologic: Grossly normal   Pelvic: External genitalia:  no lesions              Urethra:  normal appearing urethra with no masses, tenderness or lesions              Bartholin's and Skene's: normal                 Vagina: normal appearing vagina with normal color and discharge, no lesions              Cervix: anteverted              Pap taken: No. Bimanual Exam:  Uterus:  normal size, contour, position, consistency, mobility, non-tender              Adnexa: no mass, fullness, tenderness               Rectovaginal: Confirms               Anus:  normal sphincter tone,  no lesions  Chaperone present:  no  A:  Well Woman with normal exam  Postmenopausal on HRT 2012 to present History of migraine headaches - less now    P:   Reviewed health and wellness pertinent to exam  Pap smear taken today  Mammogram is due 4/16  Not ready to get colonoscopy yet - IFOB is given  Change Estradiol 1 mg to 0.5 mg daily for a year  Continue with Prometrium 100 mg daily for a year  Counseled with risk of CVA, DVT, cancer, etc.  Counseled on breast self exam, mammography screening, use and side effects of HRT, adequate intake of calcium and vitamin D, diet and exercise return annually or prn  An After Visit Summary was printed and given to the patient.

## 2014-08-30 NOTE — Patient Instructions (Signed)

## 2014-09-04 NOTE — Progress Notes (Signed)
Encounter reviewed by Dr. Josep Luviano Silva.  

## 2014-10-11 ENCOUNTER — Telehealth: Payer: Self-pay | Admitting: Nurse Practitioner

## 2014-10-11 NOTE — Telephone Encounter (Signed)
lmtcb about canceled appointment

## 2014-12-03 ENCOUNTER — Other Ambulatory Visit: Payer: Self-pay | Admitting: Nurse Practitioner

## 2014-12-05 NOTE — Telephone Encounter (Signed)
refused

## 2015-01-04 ENCOUNTER — Other Ambulatory Visit: Payer: Self-pay | Admitting: Nurse Practitioner

## 2015-01-04 NOTE — Telephone Encounter (Signed)
Medication refill request: Prometrium 100 MG Last AEX:  08-30-14  Next AEX: Not scheduled yet Last MMG (if hormonal medication request): 09-28-13 WNL Refill authorized: please advise

## 2015-01-04 NOTE — Telephone Encounter (Signed)
Left Message To Call Back to se if patient is needing rx sent to [facility].

## 2015-01-04 NOTE — Telephone Encounter (Signed)
She is past due for her Mammo and must get this done to stay on her HRT.  But note says she needs 90 day on one note and 30 day on the refill. Not sure which is correct.  I prefer 30 day which is enough time for her to schedule Mammo.  Unless her insurance says it has to be 90 day.

## 2015-01-05 ENCOUNTER — Other Ambulatory Visit: Payer: Self-pay

## 2015-01-05 DIAGNOSIS — Z1231 Encounter for screening mammogram for malignant neoplasm of breast: Secondary | ICD-10-CM

## 2015-01-05 MED ORDER — ESTRADIOL 0.5 MG PO TABS: 0.5000 mg | ORAL_TABLET | Freq: Every day | ORAL | Status: DC

## 2015-01-05 NOTE — Telephone Encounter (Signed)
Patient called back. Confirmed [facility] pharmacy.   Inform pt we will only send 30 days to pharmacy until we get results of MMG. Explained to pt risk of taking HRT and the reason we need a yearly MMG. Patient states she has an appt scheduled already. Inform pt we will send a whole year supply of her Rx once we get results of MMG. Patient verbalized understanding.   - Rx's sent to pharmacy 30 days supply. [name]

## 2015-01-11 ENCOUNTER — Ambulatory Visit
Admission: RE | Admit: 2015-01-11 | Discharge: 2015-01-11 | Disposition: A | Discharge status: Home / Self Care | Payer: 59 | Source: Ambulatory Visit

## 2015-01-11 DIAGNOSIS — Z1231 Encounter for screening mammogram for malignant neoplasm of breast: Secondary | ICD-10-CM

## 2015-01-13 ENCOUNTER — Other Ambulatory Visit: Payer: Self-pay | Admitting: Nurse Practitioner

## 2015-01-13 DIAGNOSIS — R928 Other abnormal and inconclusive findings on diagnostic imaging of breast: Secondary | ICD-10-CM

## 2015-01-19 ENCOUNTER — Ambulatory Visit
Admission: RE | Admit: 2015-01-19 | Discharge: 2015-01-19 | Disposition: A | Discharge status: Home / Self Care | Payer: 59 | Source: Ambulatory Visit | Attending: Nurse Practitioner | Admitting: Nurse Practitioner

## 2015-01-19 DIAGNOSIS — R928 Other abnormal and inconclusive findings on diagnostic imaging of breast: Secondary | ICD-10-CM

## 2015-01-23 ENCOUNTER — Other Ambulatory Visit: Payer: 59

## 2015-02-02 ENCOUNTER — Other Ambulatory Visit: Payer: Self-pay | Admitting: Nurse Practitioner

## 2015-02-03 MED ORDER — ESTRADIOL 0.5 MG PO TABS: 0.5000 mg | ORAL_TABLET | Freq: Every day | ORAL | Status: DC

## 2015-02-03 NOTE — Telephone Encounter (Signed)
Medication refill request: Estradil 0.5 mg ;  Progesterone 100 mg  Last AEX:  08/30/14 with PG  Next AEX: No AEX scheduled for 2017 Last MMG (if hormonal medication request): 01/19/15 bi-rads 1: negative Refill authorized: #30/7 for both

## 2015-02-17 ENCOUNTER — Telehealth: Payer: Self-pay | Admitting: Emergency Medicine

## 2015-02-17 NOTE — Telephone Encounter (Signed)
Out of hold per Dr. Silva   

## 2015-02-17 NOTE — Telephone Encounter (Signed)
-----   Message from Nunzio Cobbs, MD sent at 02/15/2015 10:51 PM EDT ----- Regarding: RE: Mammogram hold  Ok to remove from mammogram hold and return to routine mammogram screening.   Thanks,   Brook ----- Message -----    From: Michele Mcalpine, RN    Sent: 02/14/2015   9:01 AM      To: Brook Oletta Lamas, MD Subject: Mammogram hold                                 Dr. Quincy Simmonds,  [facility] patient in mammogram hold. Diagnostic imaging completed. Okay to remove from hold?

## 2015-06-06 ENCOUNTER — Other Ambulatory Visit: Payer: Self-pay | Admitting: Family Medicine

## 2015-07-04 ENCOUNTER — Other Ambulatory Visit: Payer: 59

## 2015-07-11 ENCOUNTER — Encounter: Payer: Self-pay | Admitting: Family Medicine

## 2015-07-11 ENCOUNTER — Ambulatory Visit (INDEPENDENT_AMBULATORY_CARE_PROVIDER_SITE_OTHER): Payer: 59 | Admitting: Family Medicine

## 2015-07-11 VITALS — Temp 98.2°F | Ht 64.0 in | Wt 177.0 lb

## 2015-07-11 DIAGNOSIS — R002 Palpitations: Secondary | ICD-10-CM | POA: Diagnosis not present

## 2015-07-11 DIAGNOSIS — G43009 Migraine without aura, not intractable, without status migrainosus: Secondary | ICD-10-CM | POA: Diagnosis not present

## 2015-07-11 DIAGNOSIS — Z0000 Encounter for general adult medical examination without abnormal findings: Secondary | ICD-10-CM

## 2015-07-11 LAB — CBC WITH DIFFERENTIAL/PLATELET
BASOS PCT: 0.5 % (ref 0.0–3.0)
Basophils Absolute: 0 10*3/uL (ref 0.0–0.1)
EOS ABS: 0.1 10*3/uL (ref 0.0–0.7)
Eosinophils Relative: 2.8 % (ref 0.0–5.0)
HCT: 40.6 % (ref 36.0–46.0)
Hemoglobin: 13.3 g/dL (ref 12.0–15.0)
Lymphocytes Relative: 38 % (ref 12.0–46.0)
Lymphs Abs: 1.9 10*3/uL (ref 0.7–4.0)
MCHC: 32.9 g/dL (ref 30.0–36.0)
MCV: 89.7 fl (ref 78.0–100.0)
MONO ABS: 0.3 10*3/uL (ref 0.1–1.0)
Monocytes Relative: 6.4 % (ref 3.0–12.0)
NEUTROS ABS: 2.7 10*3/uL (ref 1.4–7.7)
Neutrophils Relative %: 52.3 % (ref 43.0–77.0)
PLATELETS: 227 10*3/uL (ref 150.0–400.0)
RBC: 4.52 Mil/uL (ref 3.87–5.11)
RDW: 13.1 % (ref 11.5–15.5)
WBC: 5.1 10*3/uL (ref 4.0–10.5)

## 2015-07-11 LAB — LIPID PANEL
CHOLESTEROL: 194 mg/dL (ref 0–200)
HDL: 82.3 mg/dL (ref >39.00)
LDL Cholesterol: 99 mg/dL (ref 0–99)
NonHDL: 111.38
Total CHOL/HDL Ratio: 2
Triglycerides: 61 mg/dL (ref 0.0–149.0)
VLDL: 12.2 mg/dL (ref 0.0–40.0)

## 2015-07-11 LAB — POCT URINALYSIS DIPSTICK
BILIRUBIN UA: NEGATIVE
GLUCOSE UA: NEGATIVE
KETONES UA: NEGATIVE
Leukocytes, UA: NEGATIVE
Nitrite, UA: NEGATIVE
Protein, UA: NEGATIVE
Spec Grav, UA: 1.01
UROBILINOGEN UA: 0.2
pH, UA: 7

## 2015-07-11 LAB — BASIC METABOLIC PANEL
BUN: 18 mg/dL (ref 6–23)
CHLORIDE: 104 meq/L (ref 96–112)
CO2: 31 meq/L (ref 19–32)
Calcium: 9.8 mg/dL (ref 8.4–10.5)
Creatinine, Ser: 0.83 mg/dL (ref 0.40–1.20)
GFR: 76.14 mL/min (ref >60.00)
GLUCOSE: 92 mg/dL (ref 70–99)
POTASSIUM: 4.9 meq/L (ref 3.5–5.1)
Sodium: 141 mEq/L (ref 135–145)

## 2015-07-11 LAB — HEPATIC FUNCTION PANEL
ALT: 18 U/L (ref 0–35)
AST: 16 U/L (ref 0–37)
Albumin: 4.5 g/dL (ref 3.5–5.2)
Alkaline Phosphatase: 74 U/L (ref 39–117)
BILIRUBIN DIRECT: 0.1 mg/dL (ref 0.0–0.3)
BILIRUBIN TOTAL: 0.5 mg/dL (ref 0.2–1.2)
Total Protein: 7.6 g/dL (ref 6.0–8.3)

## 2015-07-11 LAB — TSH: TSH: 0.83 u[IU]/mL (ref 0.35–4.50)

## 2015-07-11 MED ORDER — CYCLOBENZAPRINE HCL 5 MG PO TABS: 5.0000 mg | ORAL_TABLET | Freq: Three times a day (TID) | ORAL | Status: DC | PRN

## 2015-07-11 MED ORDER — ATENOLOL 25 MG PO TABS: 25.0000 mg | ORAL_TABLET | ORAL | Status: DC | PRN

## 2015-07-11 MED ORDER — ZOLMITRIPTAN 2.5 MG PO TBDP: ORAL_TABLET | ORAL | Status: DC

## 2015-07-11 NOTE — Progress Notes (Signed)
   Subjective:    Patient ID: Sydney Padilla, female    DOB: 1960/09/22, 55 y.o.   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 She's recently increased her caffeine intake and that can trigger palpitations. However doesn't keep her awake at night and it doesn't cause more migraines. She's changed her diet because her weight went up to 177 pounds. She is going to nutrition program and is cut out all sugar.  She takes Flexeril when necessary for back pain  She takes estrogen and Prometrium at the direction of her GI [facility]. She's due to go back in March for follow-up. She wonders if it's time to come off the HRT  She uses OTC Flonase for allergic rhinitis and Zomig sublingual 2.5 mg when necessary for migraines  She gets routine eye care, dental care, BSE monthly, and you mammography, due a colonoscopy. She declines a flu shot and a tetanus booster after explaining that she needs a tetanus booster every 10 years she's due. Again she declined. For reasons I don't understand.  Social history she is married she lives here in [place] she works full-time for the [employer]. She spiked tennis on a regular basis.   Review of Systems  Constitutional: Negative.   HENT: Negative.   Eyes: Negative.   Respiratory: Negative.   Cardiovascular: Negative.   Gastrointestinal: Negative.   Endocrine: Negative.   Genitourinary: Negative.   Musculoskeletal: Negative.   Skin: Negative.   Allergic/Immunologic: Negative.   Neurological: Negative.   Hematological: Negative.   Psychiatric/Behavioral: Negative.        Objective:   Physical Exam  Constitutional: She appears well-developed and well-nourished.  HENT:  Head: Normocephalic and atraumatic.  Right Ear: External ear normal.  Left Ear: External ear  normal.  Nose: Nose normal.  Mouth/Throat: Oropharynx is clear and moist.  Eyes: EOM are normal. Pupils are equal, round, and reactive to light.  Neck: Normal range of motion. Neck supple. No JVD present. No tracheal deviation present. No thyromegaly present.  Cardiovascular: Normal rate, regular rhythm, normal heart sounds and intact distal pulses.  Exam reveals no gallop and no friction rub.   No murmur heard. Pulmonary/Chest: Effort normal and breath sounds normal. No stridor. No respiratory distress. She has no wheezes. She has no rales. She exhibits no tenderness.  Abdominal: Soft. Bowel sounds are normal. She exhibits no distension and no mass. There is no tenderness. There is no rebound and no guarding.  Genitourinary:  Bilateral breast exam normal  Musculoskeletal: Normal range of motion.  Lymphadenopathy:    She has no cervical adenopathy.  Neurological: She is alert. She has normal reflexes. No cranial nerve deficit. She exhibits normal muscle tone. Coordination normal.  Skin: Skin is warm and dry. No rash noted. No erythema. No pallor.  Total body skin exam normal  Psychiatric: She has a normal mood and affect. Her behavior is normal. Judgment and thought content normal.  Nursing note and vitals reviewed.         Assessment & Plan:  Healthy female  Migraine headaches,,,,,,,, continue Zomig  Overweight,,,,,,,,, continue diet exercise and weight loss program  Postmenopausal on HRT view GYN,,,,,,,,, consult with GYN March 2017 question continue HRT  Palpitations,,,,,,,, beta blocker 25 mg when necessary  Low back pain and occasional,,,,,,, Flexeril when necessary

## 2015-07-11 NOTE — Patient Instructions (Signed)
Labs today............ I will call you the report is anything abnormal  Continue diet and exercise program  We'll set you up a consult in GI to discuss screening colonoscopy  I strongly urge you get a tetanus booster  Zomig when necessary for migraines  Flexeril when necessary for back pain  Flonase when necessary for allergic rhinitis

## 2015-07-11 NOTE — Progress Notes (Signed)
Pre visit review using our clinic review tool, if applicable. No additional management support is needed unless otherwise documented below in the visit note. 

## 2015-08-11 ENCOUNTER — Encounter: Payer: Self-pay | Admitting: Internal Medicine

## 2015-08-31 ENCOUNTER — Ambulatory Visit: Payer: 59 | Admitting: Nurse Practitioner

## 2015-09-01 ENCOUNTER — Ambulatory Visit (INDEPENDENT_AMBULATORY_CARE_PROVIDER_SITE_OTHER): Payer: 59 | Admitting: Nurse Practitioner

## 2015-09-01 ENCOUNTER — Encounter: Payer: Self-pay | Admitting: Nurse Practitioner

## 2015-09-01 VITALS — BP 100/66 | HR 68 | Ht 64.0 in | Wt 159.0 lb

## 2015-09-01 DIAGNOSIS — Z01419 Encounter for gynecological examination (general) (routine) without abnormal findings: Secondary | ICD-10-CM

## 2015-09-01 DIAGNOSIS — Z1211 Encounter for screening for malignant neoplasm of colon: Secondary | ICD-10-CM | POA: Diagnosis not present

## 2015-09-01 DIAGNOSIS — Z0000 Encounter for general adult medical examination without abnormal findings: Secondary | ICD-10-CM | POA: Diagnosis not present

## 2015-09-01 MED ORDER — PROGESTERONE MICRONIZED 100 MG PO CAPS: 100.0000 mg | ORAL_CAPSULE | Freq: Every day | ORAL | Status: DC

## 2015-09-01 MED ORDER — ESTRADIOL 0.5 MG PO TABS: 0.5000 mg | ORAL_TABLET | Freq: Every day | ORAL | Status: DC

## 2015-09-01 NOTE — Progress Notes (Signed)
Patient ID: Sydney Padilla, female   DOB: 06-15-1961, 55 y.o.   MRN: [id number]  55 y.o. G0P0000 Married  Caucasian Fe here for annual exam.  Since January went to class for a healthy detox diet and eating and has lost 14 lbs.  Wants to continue HRT.  Patient's last menstrual period was 05/24/2012 (approximate).          Sexually active: Yes.    The current method of family planning is postmenopausal   Exercising: Yes.    walking and yoga Smoker:  no  Health Maintenance: Pap:  07/01/12, WNL, neg HR HPV MMG:  12/2014 with diagnostic left on 01/19/15,  Bi-Rads 1: negative, repeat in 1 year Colonoscopy:  Will schedule and have done this spring BMD: 05/15/10 T Score, -0.3 Spine / -0.8 Left Femur Neck TDaP:  2005 Hep C and HIV: declines at this time Labs: 06/2015 in EPIC   reports that she has never smoked. She has never used smokeless tobacco. She reports that she drinks alcohol. She reports that she does not use illicit drugs.  Past Medical History  Diagnosis Date  . Migraines   . Tachycardia 2000    occasional use of Atenolo    Past Surgical History  Procedure Laterality Date  . Wisdom tooth extraction  age 63    Current Outpatient Prescriptions  Medication Sig Dispense Refill  . atenolol (TENORMIN) 25 MG tablet Take 1 tablet (25 mg total) by mouth as needed (heart rate). 25 tablet 3  . cyclobenzaprine (FLEXERIL) 5 MG tablet Take 1 tablet (5 mg total) by mouth 3 (three) times daily as needed for muscle spasms. 30 tablet 2  . estradiol (ESTRACE) 0.5 MG tablet Take 1 tablet (0.5 mg total) by mouth daily. 30 tablet 12  . fluticasone (FLONASE) 50 MCG/ACT nasal spray Place 2 sprays into both nostrils daily. 16 g 6  . progesterone (PROMETRIUM) 100 MG capsule Take 1 capsule (100 mg total) by mouth daily. 30 capsule 12  . zolmitriptan (ZOMIG-ZMT) 2.5 MG disintegrating tablet TAKE 1 TABLET(S) BY MOUTH AS NEEDED FOR HEADACHE 6 tablet 2   No current facility-administered medications for this  visit.    Family History  Problem Relation Age of Onset  . Hypertension Mother   . Osteoarthritis Mother   . Heart disease Father     Psychologist, forensic  . Diabetes Maternal Grandmother   . Hypertension Maternal Grandmother   . Heart disease Maternal Grandfather   . Cancer Other     uterine    ROS:  Pertinent items are noted in HPI.  Otherwise, a comprehensive ROS was negative.  Exam:   BP 100/66 mmHg  Pulse 68  Ht 5\' 4"  (1.626 m)  Wt 159 lb (72.122 kg)  BMI 27.28 kg/m2  LMP 05/24/2012 (Approximate) Height: 5\' 4"  (162.6 cm) Ht Readings from Last 3 Encounters:  09/01/15 5\' 4"  (1.626 m)  07/11/15 5\' 4"  (1.626 m)  08/30/14 5' 4.25" (1.632 m)    General appearance: alert, cooperative and appears stated age Head: Normocephalic, without obvious abnormality, atraumatic Neck: no adenopathy, supple, symmetrical, trachea midline and thyroid normal to inspection and palpation Lungs: clear to auscultation bilaterally Breasts: normal appearance, no masses or tenderness Heart: regular rate and rhythm Abdomen: soft, non-tender; no masses,  no organomegaly Extremities: extremities normal, atraumatic, no cyanosis or edema Skin: Skin color, texture, turgor normal. No rashes or lesions Lymph nodes: Cervical, supraclavicular, and axillary nodes normal. No abnormal inguinal nodes palpated Neurologic: Grossly normal  Pelvic: External genitalia:  no lesions              Urethra:  normal appearing urethra with no masses, tenderness or lesions              Bartholin's and Skene's: normal                 Vagina: normal appearing vagina with normal color and discharge, no lesions              Cervix: anteverted              Pap taken: Yes.   Bimanual Exam:  Uterus:  normal size, contour, position, consistency, mobility, non-tender              Adnexa: no mass, fullness, tenderness               Rectovaginal: Confirms               Anus:  normal sphincter tone, no lesions  Chaperone present:  yes  A:  Well Woman with normal exam  Postmenopausal on HRT 2012 to present History of migraine headaches - less now    P:   Reviewed health and wellness pertinent to exam  Pap smear as above  Mammogram is due 12/2015  Refill on Estrace 0.5 mg and Prometrium 100 mg for a year  Counseled with risk of DVT, CVA, cancer, etc  Counseled on breast self exam, mammography screening, adequate intake of calcium and vitamin D, diet and exercise return annually or prn  An After Visit Summary was printed and given to the patient.

## 2015-09-01 NOTE — Patient Instructions (Signed)

## 2015-09-03 NOTE — Progress Notes (Signed)
Encounter reviewed by Dr. Brook Amundson C. Silva.  

## 2015-09-05 LAB — IPS PAP TEST WITH HPV

## 2015-11-23 ENCOUNTER — Encounter: Payer: Self-pay | Admitting: Family Medicine

## 2015-11-23 ENCOUNTER — Ambulatory Visit (INDEPENDENT_AMBULATORY_CARE_PROVIDER_SITE_OTHER): Payer: 59 | Admitting: Family Medicine

## 2015-11-23 VITALS — BP 130/80 | HR 80 | Temp 98.0°F | Ht 64.0 in | Wt 151.0 lb

## 2015-11-23 DIAGNOSIS — W57XXXA Bitten or stung by nonvenomous insect and other nonvenomous arthropods, initial encounter: Secondary | ICD-10-CM

## 2015-11-23 DIAGNOSIS — S30861A Insect bite (nonvenomous) of abdominal wall, initial encounter: Secondary | ICD-10-CM

## 2015-11-23 MED ORDER — ZOLMITRIPTAN 2.5 MG PO TBDP: ORAL_TABLET | ORAL | Status: DC

## 2015-11-23 MED ORDER — DOXYCYCLINE HYCLATE 100 MG PO TABS: ORAL_TABLET | ORAL | Status: DC

## 2015-11-23 NOTE — Progress Notes (Signed)
Pre visit review using our clinic review tool, if applicable. No additional management support is needed unless otherwise documented below in the visit note. 

## 2015-11-23 NOTE — Progress Notes (Signed)
Subjective:    Patient ID: Sydney Padilla, female    DOB: December 03, 1960, 55 y.o.   MRN: [id number]  HPI  Sydney Padilla is a 55 year old female who presents today after she noticed a tick next to her umbilicus when waking from her sleep. She is unsure of length of time tick was attached but believes it may have been >36 hours. She reports sleeping with her long haired dogs that have been treated for ticks. Tick bite occurred on the right side of the umbilicus and she removed tick intact 1 day ago  and cleaned area with alcohol.  She presents today for evaluation due to upcoming out of state trip that is occurring tomorrow. She denies fever, chills, sweats, fatigue, headache, myalgias, or pruritus in area of bite.  Review of Systems  Constitutional: Negative for fever, chills and fatigue.  HENT: Negative for congestion and sore throat.   Respiratory: Negative for cough, shortness of breath and wheezing.   Cardiovascular: Negative for chest pain, palpitations and leg swelling.  Gastrointestinal: Negative for nausea, vomiting, abdominal pain, diarrhea and constipation.  Musculoskeletal: Negative for myalgias and arthralgias.  Skin:       Redness from tick bite at umbilicus  Neurological: Negative for dizziness, light-headedness and headaches.   Past Medical History  Diagnosis Date  . Migraines   . Tachycardia 2000    occasional use of Atenolo     Social History   Social History  . Marital Status: Married    Spouse Name: N/A  . Number of Children: 0  . Years of Education: N/A   Occupational History  .     Social History Main Topics  . Smoking status: Never Smoker   . Smokeless tobacco: Never Used  . Alcohol Use: 0.0 - 0.5 oz/week    0-1 drink(s) per week  . Drug Use: No  . Sexual Activity:    Partners: Male    Birth Control/ Protection: Post-menopausal   Other Topics Concern  . Not on file   Social History Narrative    Past Surgical History  Procedure Laterality Date    . Wisdom tooth extraction  age 26    Family History  Problem Relation Age of Onset  . Hypertension Mother   . Osteoarthritis Mother   . Heart disease Father     Psychologist, forensic  . Diabetes Maternal Grandmother   . Hypertension Maternal Grandmother   . Heart disease Maternal Grandfather   . Cancer Other     uterine    No Known Allergies  Current Outpatient Prescriptions on File Prior to Visit  Medication Sig Dispense Refill  . atenolol (TENORMIN) 25 MG tablet Take 1 tablet (25 mg total) by mouth as needed (heart rate). 25 tablet 3  . cyclobenzaprine (FLEXERIL) 5 MG tablet Take 1 tablet (5 mg total) by mouth 3 (three) times daily as needed for muscle spasms. 30 tablet 2  . estradiol (ESTRACE) 0.5 MG tablet Take 1 tablet (0.5 mg total) by mouth daily. 30 tablet 12  . fluticasone (FLONASE) 50 MCG/ACT nasal spray Place 2 sprays into both nostrils daily. 16 g 6  . progesterone (PROMETRIUM) 100 MG capsule Take 1 capsule (100 mg total) by mouth daily. 30 capsule 12   No current facility-administered medications on file prior to visit.    BP 130/80 mmHg  Pulse 80  Temp(Src) 98 F (36.7 C) (Oral)  Ht '5\' 4"'$  (1.626 m)  Wt 151 lb (68.493 kg)  BMI 25.91  kg/m2  SpO2 98%  LMP 05/24/2012 (Approximate)      Objective:   Physical Exam  Constitutional: She is oriented to person, place, and time. She appears well-developed and well-nourished.  Eyes: Pupils are equal, round, and reactive to light. No scleral icterus.  Neck: Neck supple.  Cardiovascular: Normal rate and regular rhythm.   Pulmonary/Chest: Effort normal and breath sounds normal. She has no wheezes. She has no rales.  Abdominal: Soft. Bowel sounds are normal. There is no tenderness.  Lymphadenopathy:    She has no cervical adenopathy.  Neurological: She is alert and oriented to person, place, and time.  Skin: Skin is warm and dry.  Approximately 5 mm area of redness with central punctate area noted. No evidence of erythema  migrans rash with central clearing present.   Psychiatric: She has a normal mood and affect. Her behavior is normal. Judgment and thought content normal.       Assessment & Plan:  1. Tick bite of abdomen, initial encounter Exam and history support prophlyactic treatment. Tick attachment is suspected to be >36 hours and the removal of the tick has occurred within 72 hours and patient does not have a contraindication to doxycycline therapy. Discussed with patient that it is unlikely any tick borne disease transmission has occurred. However due to the criteria being met for preventive therapy and patient's upcoming trip out of state tomorrow, Doxycycline has been provided. Advised patient to observe for symptoms such as unexplained fever, rash that develops, or increase in redness and swelling in area of tick bite. If she notices these symptoms or any additional symptoms, she is advised to follow up for further evaluation and treatment. Patient voiced understanding and agreed with plan.  - doxycycline (VIBRA-TABS) 100 MG tablet; Take both tablets at the same time today.  Dispense: 2 tablet; Refill: 0  Delano Metz, FNP-C

## 2015-11-23 NOTE — Patient Instructions (Signed)
Please take medication as directed. If you notice any increased redness, rash, or fever, please follow up for further evaluation and treatment.   Tick Bite Information Ticks are insects that attach themselves to the skin and draw blood for food. There are various types of ticks. Common types include wood ticks and deer ticks. Most ticks live in shrubs and grassy areas. Ticks can climb onto your body when you make contact with leaves or grass where the tick is waiting. The most common places on the body for ticks to attach themselves are the scalp, neck, armpits, waist, and groin. Most tick bites are harmless, but sometimes ticks carry germs that cause diseases. These germs can be spread to a person during the tick's feeding process. The chance of a disease spreading through a tick bite depends on:   The type of tick.  Time of year.   How long the tick is attached.   Geographic location.  HOW CAN YOU PREVENT TICK BITES? Take these steps to help prevent tick bites when you are outdoors:  Wear protective clothing. Long sleeves and long pants are best.   Wear white clothes so you can see ticks more easily.  Tuck your pant legs into your socks.   If walking on a trail, stay in the middle of the trail to avoid brushing against bushes.  Avoid walking through areas with long grass.  Put insect repellent on all exposed skin and along boot tops, pant legs, and sleeve cuffs.   Check clothing, hair, and skin repeatedly and before going inside.   Brush off any ticks that are not attached.  Take a shower or bath as soon as possible after being outdoors.  WHAT IS THE PROPER WAY TO REMOVE A TICK? Ticks should be removed as soon as possible to help prevent diseases caused by tick bites. 1. If latex gloves are available, put them on before trying to remove a tick.  2. Using fine-point tweezers, grasp the tick as close to the skin as possible. You may also use curved forceps or a tick  removal tool. Grasp the tick as close to its head as possible. Avoid grasping the tick on its body. 3. Pull gently with steady upward pressure until the tick lets go. Do not twist the tick or jerk it suddenly. This may break off the tick's head or mouth parts. 4. Do not squeeze or crush the tick's body. This could force disease-carrying fluids from the tick into your body.  5. After the tick is removed, wash the bite area and your hands with soap and water or other disinfectant such as alcohol. 6. Apply a small amount of antiseptic cream or ointment to the bite site.  7. Wash and disinfect any instruments that were used.  Do not try to remove a tick by applying a hot match, petroleum jelly, or fingernail polish to the tick. These methods do not work and may increase the chances of disease being spread from the tick bite.  WHEN SHOULD YOU SEEK MEDICAL CARE? Contact your health care provider if you are unable to remove a tick from your skin or if a part of the tick breaks off and is stuck in the skin.  After a tick bite, you need to be aware of signs and symptoms that could be related to diseases spread by ticks. Contact your health care provider if you develop any of the following in the days or weeks after the tick bite:  Unexplained fever.  Rash. A circular rash that appears days or weeks after the tick bite may indicate the possibility of Lyme disease. The rash may resemble a target with a bull's-eye and may occur at a different part of your body than the tick bite.  Redness and swelling in the area of the tick bite.   Tender, swollen lymph glands.   Diarrhea.   Weight loss.   Cough.   Fatigue.   Muscle, joint, or bone pain.   Abdominal pain.   Headache.   Lethargy or a change in your level of consciousness.  Difficulty walking or moving your legs.   Numbness in the legs.   Paralysis.  Shortness of breath.   Confusion.   Repeated vomiting.    This  information is not intended to replace advice given to you by your health care provider. Make sure you discuss any questions you have with your health care provider.   Document Released: 06/07/2000 Document Revised: 07/01/2014 Document Reviewed: 11/18/2012 Elsevier Interactive Patient Education [employer].

## 2016-02-14 ENCOUNTER — Other Ambulatory Visit: Payer: Self-pay | Admitting: Nurse Practitioner

## 2016-02-14 DIAGNOSIS — Z1231 Encounter for screening mammogram for malignant neoplasm of breast: Secondary | ICD-10-CM

## 2016-02-21 ENCOUNTER — Ambulatory Visit
Admission: RE | Admit: 2016-02-21 | Discharge: 2016-02-21 | Disposition: A | Discharge status: Home / Self Care | Payer: 59 | Source: Ambulatory Visit | Attending: Nurse Practitioner | Admitting: Nurse Practitioner

## 2016-02-21 DIAGNOSIS — Z1231 Encounter for screening mammogram for malignant neoplasm of breast: Secondary | ICD-10-CM

## 2016-06-13 ENCOUNTER — Encounter: Payer: Self-pay | Admitting: Family Medicine

## 2016-06-13 ENCOUNTER — Ambulatory Visit (INDEPENDENT_AMBULATORY_CARE_PROVIDER_SITE_OTHER): Payer: 59 | Admitting: Family Medicine

## 2016-06-13 VITALS — BP 116/84 | HR 76 | Temp 98.0°F | Wt 157.2 lb

## 2016-06-13 DIAGNOSIS — L509 Urticaria, unspecified: Secondary | ICD-10-CM | POA: Diagnosis not present

## 2016-06-13 MED ORDER — PREDNISONE 10 MG PO TABS: ORAL_TABLET | ORAL | 0 refills | Status: DC

## 2016-06-13 NOTE — Patient Instructions (Signed)
Please take Allegra, Claritin, or Zyrtec daily for symptoms. Also, prednisone has been sent to your pharmacy for symptoms. Avoid use of nylon and switch to cotton undergarments as we discussed. Follow up if symptoms do not improve, worsen, or you develop new symptoms.   Hives Introduction Hives (urticaria) are itchy, red, swollen areas on your skin. Hives can show up on any part of your body, and they can vary in size. They can be as small as the tip of a pen or much larger. Hives often fade within 24 hours (acute hives). In other cases, new hives show up after old ones fade. This can continue for many days or weeks (chronic hives). Hives are caused by your body's reaction to an irritant or to something that you are allergic to (trigger). You can get hives right after being around a trigger or hours later. Hives do not spread from person to person (are not contagious). Hives may get worse if you scratch them, if you exercise, or if you have worries (emotional stress). Follow these instructions at home: Medicines  Take or apply over-the-counter and prescription medicines only as told by your doctor.  If you were prescribed an antibiotic medicine, use it as told by your doctor. Do not stop taking the antibiotic even if you start to feel better. Skin Care  Apply cool, wet cloths (cool compresses) to the itchy, red, swollen areas.  Do not scratch your skin. Do not rub your skin. General instructions  Do not take hot showers or baths. This can make itching worse.  Do not wear tight clothes.  Use sunscreen and wear clothing that covers your skin when you are outside.  Avoid any triggers that cause your hives. Keep a journal to help you keep track of what causes your hives. Write down:  What medicines you take.  What you eat and drink.  What products you use on your skin.  Keep all follow-up visits as told by your doctor. This is important. Contact a doctor if:  Your symptoms are not  better with medicine.  Your joints are painful or swollen. Get help right away if:  You have a fever.  You have belly pain.  Your tongue or lips are swollen.  Your eyelids are swollen.  Your chest or throat feels tight.  You have trouble breathing or swallowing. These symptoms may be an emergency. Do not wait to see if the symptoms will go away. Get medical help right away. Call your local emergency services (911 in the [place] Do not drive yourself to the hospital.  This information is not intended to replace advice given to you by your health care provider. Make sure you discuss any questions you have with your health care provider. Document Released: 03/19/2008 Document Revised: 11/16/2015 Document Reviewed: 03/29/2015  2017 Elsevier

## 2016-06-13 NOTE — Progress Notes (Signed)
Subjective:    Patient ID: Sydney Padilla, female    DOB: [birth year], 55 y.o.   MRN: [id number]  HPI  Sydney Padilla is a 55 year old female who presents today with a rash around panty line that has been present for months.  Associated pruritus occurs intermittently when skin is dry but is not consistent. 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  No aggravating or alleviating factors noted She denies any change in detergent, fabric softeners, new lotions/soaps/medications.  She denies fever, chills, sweats, N/V/D, chest pain palpitations, SOB, difficulty breathing or trouble swallowing.  Treatment at home with lotion that has provided limited benefit. She reports a rare occasion of cortisone treatment.    Review of Systems  Constitutional: Negative for chills, fatigue and fever.  HENT: Negative for postnasal drip and rhinorrhea.   Respiratory: Negative for cough and wheezing.   Cardiovascular: Negative for chest pain and palpitations.  Gastrointestinal: Negative for abdominal pain, constipation, diarrhea, nausea and vomiting.  Genitourinary: Negative for dysuria.  Musculoskeletal: Negative for myalgias.  Skin: Positive for rash.   Past Medical History:  Diagnosis Date  . Migraines   . Tachycardia 2000   occasional use of Atenolo     Social History   Social History  . Marital status: Married    Spouse name: N/A  . Number of children: 0  . Years of education: N/A   Occupational History  .  [employer]   Social History Main Topics  . Smoking status: Never Smoker  . Smokeless tobacco: Never Used  . Alcohol use 0.0 - 0.5 oz/week    0 - 1 drink(s) per week  . Drug use: No  . Sexual activity: Yes    Partners: Male    Birth control/ protection: Post-menopausal   Other Topics Concern  . Not on file   Social History Narrative  . No narrative on file    Past Surgical History:  Procedure Laterality Date  .  WISDOM TOOTH EXTRACTION  age 34    Family History  Problem Relation Age of Onset  . Hypertension Mother   . Osteoarthritis Mother   . Heart disease Father     Psychologist, forensic  . Diabetes Maternal Grandmother   . Hypertension Maternal Grandmother   . Heart disease Maternal Grandfather   . Cancer Other     uterine    No Known Allergies  Current Outpatient Prescriptions on File Prior to Visit  Medication Sig Dispense Refill  . atenolol (TENORMIN) 25 MG tablet Take 1 tablet (25 mg total) by mouth as needed (heart rate). 25 tablet 3  . cyclobenzaprine (FLEXERIL) 5 MG tablet Take 1 tablet (5 mg total) by mouth 3 (three) times daily as needed for muscle spasms. 30 tablet 2  . estradiol (ESTRACE) 0.5 MG tablet Take 1 tablet (0.5 mg total) by mouth daily. 30 tablet 12  . fluticasone (FLONASE) 50 MCG/ACT nasal spray Place 2 sprays into both nostrils daily. 16 g 6  . progesterone (PROMETRIUM) 100 MG capsule Take 1 capsule (100 mg total) by mouth daily. 30 capsule 12  . zolmitriptan (ZOMIG-ZMT) 2.5 MG disintegrating tablet TAKE 1 TABLET(S) BY MOUTH AS NEEDED FOR HEADACHE 6 tablet 5   No current facility-administered medications on file prior to visit.     BP 116/84 (BP Location: Left Arm, Patient Position: Sitting, Cuff Size: Normal)   Pulse 76   Temp 98 F (  36.7 C) (Oral)   Wt 157 lb 3.2 oz (71.3 kg)   LMP 05/24/2012 (Approximate)   SpO2 99%   BMI 26.98 kg/m       Objective:   Physical Exam  Constitutional: She is oriented to person, place, and time. She appears well-developed and well-nourished.  HENT:  Nose: No rhinorrhea. Right sinus exhibits no maxillary sinus tenderness and no frontal sinus tenderness. Left sinus exhibits no maxillary sinus tenderness and no frontal sinus tenderness.  Mouth/Throat: Mucous membranes are normal. No oropharyngeal exudate or posterior oropharyngeal erythema.  Eyes: Pupils are equal, round, and reactive to light. No scleral icterus.  Cardiovascular:  Normal rate and regular rhythm.   Pulmonary/Chest: Effort normal and breath sounds normal. She has no wheezes. She has no rales.  Abdominal: Soft. Bowel sounds are normal. There is no tenderness.  Musculoskeletal: She exhibits no edema.  Neurological: She is alert and oriented to person, place, and time. Coordination normal.  Skin: Skin is warm and dry.  Raised, erythematous plaques serpiginous pattern with central clearing suggestive of Urticaria surrounding panty lines around waistband and legs.       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 Advised patient if symptoms do not improve with treatment, hypoallergenic detergents/soaps, follow up for further evaluation and treatment as allergy testing may be indicated.  - predniSONE (DELTASONE) 10 MG tablet; Take 4 tablets once daily for 2 days, 3 tabs daily for 2 days, 2 tabs daily for 2 days, 1 tab daily for 2 days.  Dispense: 20 tablet; Refill: 0  Delano Metz, FNP-C

## 2016-09-08 ENCOUNTER — Other Ambulatory Visit: Payer: Self-pay | Admitting: Nurse Practitioner

## 2016-09-09 NOTE — Telephone Encounter (Signed)
May give her a refill X 1 month until AEX is done.

## 2016-09-09 NOTE — Telephone Encounter (Signed)
Medication refill request: progesterone, estrace  Last AEX:  09/01/15 PG Next AEX: none  Last MMG (if hormonal medication request): 02/21/16 [id number]:neg  Refill authorized: both 09/01/15 #[id number]  Left voicemail to call back to schedule AEX.   Please advise.

## 2016-10-08 ENCOUNTER — Ambulatory Visit (INDEPENDENT_AMBULATORY_CARE_PROVIDER_SITE_OTHER): Payer: 59 | Admitting: Nurse Practitioner

## 2016-10-08 ENCOUNTER — Encounter: Payer: Self-pay | Admitting: Nurse Practitioner

## 2016-10-08 VITALS — BP 120/66 | HR 72 | Ht 64.0 in | Wt 158.0 lb

## 2016-10-08 DIAGNOSIS — Z0000 Encounter for general adult medical examination without abnormal findings: Secondary | ICD-10-CM

## 2016-10-08 DIAGNOSIS — Z79890 Hormone replacement therapy (postmenopausal): Secondary | ICD-10-CM

## 2016-10-08 DIAGNOSIS — N951 Menopausal and female climacteric states: Secondary | ICD-10-CM | POA: Diagnosis not present

## 2016-10-08 DIAGNOSIS — Z01419 Encounter for gynecological examination (general) (routine) without abnormal findings: Secondary | ICD-10-CM | POA: Diagnosis not present

## 2016-10-08 DIAGNOSIS — Z1211 Encounter for screening for malignant neoplasm of colon: Secondary | ICD-10-CM

## 2016-10-08 DIAGNOSIS — L509 Urticaria, unspecified: Secondary | ICD-10-CM

## 2016-10-08 MED ORDER — PROGESTERONE MICRONIZED 100 MG PO CAPS: 100.0000 mg | ORAL_CAPSULE | Freq: Every day | ORAL | 4 refills | Status: DC

## 2016-10-08 MED ORDER — ESTRADIOL 0.5 MG PO TABS: 0.5000 mg | ORAL_TABLET | Freq: Every day | ORAL | 4 refills | Status: DC

## 2016-10-08 MED ORDER — ZOLMITRIPTAN 2.5 MG PO TBDP: ORAL_TABLET | ORAL | 6 refills | Status: DC

## 2016-10-08 NOTE — Patient Instructions (Signed)

## 2016-10-08 NOTE — Progress Notes (Signed)
Patient ID: Sydney Padilla, female   DOB: 04-Sep-1960, 56 y.o.   MRN: [id number]  56 y.o. G0P0000 Married  Caucasian Fe here for annual exam.  Since December 21 st she has a reaction to synthetics and took a prednisone course. Prior history many yrs ago of same allergy.   Now on allegra but will switch to Zyrtec.  Patient's last menstrual period was 05/24/2012 (approximate).          Sexually active: Yes.    The current method of family planning is post menopausal status.    Exercising: Yes.    walking and yoga. Smoker:  no  Health Maintenance: Pap: 09/01/15, Negative with neg HR HPV  07/01/12, Negative with neg HR HPV History of Abnormal Pap: no MMG: 02/21/16, 3D, Bi-Rads 1:  Negative Self Breast exams: sometimes Colonoscopy:  Never - declines at this time BMD: 05/15/10 T Score, -0.3 Spine / -0.8 Left Femur Neck TDaP: 05/13/05, will update in May at [facility] Hep C and HIV: declined in 2017 Labs: PCP takes care of all labs   reports that she has never smoked. She has never used smokeless tobacco. She reports that she drinks alcohol. She reports that she does not use drugs.  Past Medical History:  Diagnosis Date  . Migraine without aura since college   more regualr with menses, now in menopause and less frequent  . Tachycardia 2000   occasional use of Atenol    Past Surgical History:  Procedure Laterality Date  . WISDOM TOOTH EXTRACTION  age 67    Current Outpatient Prescriptions  Medication Sig Dispense Refill  . fexofenadine (ALLEGRA) 180 MG tablet Take 180 mg by mouth daily.    Marland Kitchen atenolol (TENORMIN) 25 MG tablet Take 1 tablet (25 mg total) by mouth as needed (heart rate). 25 tablet 3  . cyclobenzaprine (FLEXERIL) 5 MG tablet Take 1 tablet (5 mg total) by mouth 3 (three) times daily as needed for muscle spasms. 30 tablet 2  . estradiol (ESTRACE) 0.5 MG tablet Take 1 tablet (0.5 mg total) by mouth daily. 90 tablet 4  . fluticasone (FLONASE) 50 MCG/ACT nasal spray Place 2 sprays into  both nostrils daily. 16 g 6  . predniSONE (DELTASONE) 10 MG tablet Take 4 tablets once daily for 2 days, 3 tabs daily for 2 days, 2 tabs daily for 2 days, 1 tab daily for 2 days. 20 tablet 0  . progesterone (PROMETRIUM) 100 MG capsule Take 1 capsule (100 mg total) by mouth daily. 90 capsule 4  . zolmitriptan (ZOMIG-ZMT) 2.5 MG disintegrating tablet TAKE 1 TABLET(S) BY MOUTH AS NEEDED FOR HEADACHE 6 tablet 6   No current facility-administered medications for this visit.     Family History  Problem Relation Age of Onset  . Hypertension Mother   . Osteoarthritis Mother   . Heart disease Father     Psychologist, forensic  . Atrial fibrillation Father   . Diabetes Maternal Grandmother   . Hypertension Maternal Grandmother   . Stroke Maternal Grandmother   . Heart disease Maternal Grandfather   . Cancer Other     uterine    ROS:  Pertinent items are noted in HPI.  Otherwise, a comprehensive ROS was negative.  Exam:   BP 120/66 (BP Location: Right Arm, Patient Position: Sitting, Cuff Size: Normal)   Pulse 72   Ht 5\' 4"  (1.626 m)   Wt 158 lb (71.7 kg)   LMP 05/24/2012 (Approximate)   BMI 27.12 kg/m  Height: 5'  4" (162.6 cm) Ht Readings from Last 3 Encounters:  10/08/16 5\' 4"  (1.626 m)  11/23/15 5\' 4"  (1.626 m)  09/01/15 5\' 4"  (1.626 m)    General appearance: alert, cooperative and appears stated age Head: Normocephalic, without obvious abnormality, atraumatic Neck: no adenopathy, supple, symmetrical, trachea midline and thyroid normal to inspection and palpation Lungs: clear to auscultation bilaterally Breasts: normal appearance, no masses or tenderness Heart: regular rate and rhythm Abdomen: soft, non-tender; no masses,  no organomegaly Extremities: extremities normal, atraumatic, no cyanosis or edema Skin: Skin color, texture, turgor normal. No rashes or lesions.  multiple areas of urticaria from her waist to mid thighs. Lymph nodes: Cervical, supraclavicular, and axillary nodes  normal. No abnormal inguinal nodes palpated Neurologic: Grossly normal   Pelvic: External genitalia:  no lesions              Urethra:  normal appearing urethra with no masses, tenderness or lesions              Bartholin's and Skene's: normal                 Vagina: normal appearing vagina with normal color and discharge, no lesions              Cervix: anteverted              Pap taken: No. 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  Refill on Zomig which is used infrequent  Counseled on breast self exam, mammography screening, use and side effects of HRT, adequate intake of calcium and vitamin D, diet and exercise, Kegel's exercises return annually or prn  An After Visit Summary was printed and given to the patient.

## 2016-10-10 NOTE — Progress Notes (Signed)
Encounter reviewed by Dr. Macaela Presas Amundson C. Silva.  

## 2016-11-21 ENCOUNTER — Ambulatory Visit (INDEPENDENT_AMBULATORY_CARE_PROVIDER_SITE_OTHER): Payer: 59 | Admitting: Family Medicine

## 2016-11-21 ENCOUNTER — Encounter: Payer: Self-pay | Admitting: Family Medicine

## 2016-11-21 VITALS — BP 118/78 | HR 68 | Temp 98.3°F | Wt 160.0 lb

## 2016-11-21 DIAGNOSIS — L509 Urticaria, unspecified: Secondary | ICD-10-CM | POA: Diagnosis not present

## 2016-11-21 MED ORDER — TRIAMCINOLONE ACETONIDE 0.1 % EX CREA
1.0000 | TOPICAL_CREAM | Freq: Two times a day (BID) | CUTANEOUS | 0 refills | Status: DC
Start: 2016-11-21 — End: 2017-10-23

## 2016-11-21 NOTE — Progress Notes (Signed)
Subjective:    Patient ID: Sydney Padilla, female    DOB: August 01, 1960, 56 y.o.   MRN: [id number]  HPI  Sydney Padilla is a 56 year old female who presents today for history of a rash that has been chronic around her panty line and upper legs. She reports that rash improves when she works at home and does not wear work clothes with synthetic pants.   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 She was evaluated on 06/13/16 for this rash and was provided prednisone which provided benefit short term however symptom did not completely resolve. She also switched to cotton underwear which has resolved rash around waist of panty line. Most benefit for this rash occurs when she is not wearing her synthetic "work clothes". Symptoms are worse when she wears synthetic materials for extended period of time.  Associated mild pruritus is present.  She denies any change in detergent, fabric softener, new lotions/soaps/medications. She denies fever, chills, sweats, N/V/D, chest pain, palpitations, SOB, difficulty breathing or trouble swallowing.  Treatment at home includes zyrtec and lotion which provides minimal benefit.     Review of Systems  Constitutional: Negative for chills, fatigue and fever.  HENT: Negative for congestion, postnasal drip, rhinorrhea, sinus pain, sinus pressure, sneezing and sore throat.   Eyes: Negative for itching.  Respiratory: Negative for cough, shortness of breath and wheezing.   Cardiovascular: Negative for chest pain and palpitations.  Gastrointestinal: Negative for abdominal pain, diarrhea, nausea and vomiting.  Musculoskeletal: Negative for myalgias.  Skin: Positive for rash.  Neurological: Negative for dizziness and headaches.   Past Medical History:  Diagnosis Date  . Migraine without aura since college   more regualr with menses, now in menopause and less frequent  . Tachycardia 2000   occasional use of Atenol     Social  History   Social History  . Marital status: Married    Spouse name: N/A  . Number of children: 0  . Years of education: N/A   Occupational History  .  [employer]   Social History Main Topics  . Smoking status: Never Smoker  . Smokeless tobacco: Never Used  . Alcohol use 0.0 - 0.5 oz/week  . Drug use: No  . Sexual activity: Yes    Partners: Male    Birth control/ protection: Post-menopausal   Other Topics Concern  . Not on file   Social History Narrative   Lives with husband.  Works at [employer].    Past Surgical History:  Procedure Laterality Date  . WISDOM TOOTH EXTRACTION  age 35    Family History  Problem Relation Age of Onset  . Hypertension Mother   . Osteoarthritis Mother   . Heart disease Father        Psychologist, forensic  . Atrial fibrillation Father   . Diabetes Maternal Grandmother   . Hypertension Maternal Grandmother   . Stroke Maternal Grandmother   . Heart disease Maternal Grandfather   . Cancer Other        uterine    No Known Allergies  Current Outpatient Prescriptions on File Prior to Visit  Medication Sig Dispense Refill  . atenolol (TENORMIN) 25 MG tablet Take 1 tablet (25 mg total) by mouth as needed (heart rate). 25 tablet 3  . cyclobenzaprine (FLEXERIL) 5 MG tablet Take 1 tablet (5 mg total) by mouth 3 (three) times daily as needed for muscle spasms. 30 tablet 2  .  estradiol (ESTRACE) 0.5 MG tablet Take 1 tablet (0.5 mg total) by mouth daily. 90 tablet 4  . fexofenadine (ALLEGRA) 180 MG tablet Take 180 mg by mouth daily.    . fluticasone (FLONASE) 50 MCG/ACT nasal spray Place 2 sprays into both nostrils daily. 16 g 6  . predniSONE (DELTASONE) 10 MG tablet Take 4 tablets once daily for 2 days, 3 tabs daily for 2 days, 2 tabs daily for 2 days, 1 tab daily for 2 days. 20 tablet 0  . progesterone (PROMETRIUM) 100 MG capsule Take 1 capsule (100 mg total) by mouth daily. 90 capsule 4  . zolmitriptan (ZOMIG-ZMT) 2.5 MG disintegrating tablet TAKE 1 TABLET(S) BY MOUTH  AS NEEDED FOR HEADACHE 6 tablet 6   No current facility-administered medications on file prior to visit.     BP 118/78 (BP Location: Left Arm, Patient Position: Sitting, Cuff Size: Normal)   Pulse 68   Temp 98.3 F (36.8 C) (Oral)   Wt 160 lb (72.6 kg)   LMP 05/24/2012 (Approximate)   SpO2 97%   BMI 27.46 kg/m        Objective:   Physical Exam  Constitutional: She appears well-developed and well-nourished.  Eyes: Pupils are equal, round, and reactive to light. No scleral icterus.  Neck: Neck supple.  Cardiovascular: Normal rate and regular rhythm.   Pulmonary/Chest: Effort normal and breath sounds normal. She has no wheezes. She has no rales.  Abdominal: Soft. Bowel sounds are normal. There is no tenderness.  Lymphadenopathy:    She has no cervical adenopathy.  Skin: Skin is warm and dry. Rash noted.  Circumscribed, raised, erythematous plaques that are oval in appearance noted on upper and inner thighs bilaterally and beneath buttocks at panty line. Lesions range from 1.0 cm to 2.5 cm.           Assessment & Plan:  1. 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 She has a dermatologist that she prefers to see so will try this route first. Also advised her to keep a journal of clothing materials worn and symptoms noted. Return precautions advised. - triamcinolone cream (KENALOG) 0.1 %; Apply 1 application topically 2 (two) times daily.  Dispense: 30 g; Refill: 0  Follow up if symptoms do not improve with treatment. Delano Metz, FNP-C

## 2016-11-21 NOTE — Patient Instructions (Addendum)
It was a pleasure to see you today. Please try medication and contact dermatologist for further evaluation.  Also, please keep a diary of clothing material and wear cotton when possible.  Continue zyrtec daily.   Hives Hives (urticaria) are itchy, red, swollen areas on your skin. Hives can show up on any part of your body, and they can vary in size. They can be as small as the tip of a pen or much larger. Hives often fade within 24 hours (acute hives). In other cases, new hives show up after old ones fade. This can continue for many days or weeks (chronic hives). Hives are caused by your body's reaction to an irritant or to something that you are allergic to (trigger). You can get hives right after being around a trigger or hours later. Hives do not spread from person to person (are not contagious). Hives may get worse if you scratch them, if you exercise, or if you have worries (emotional stress). Follow these instructions at home: Medicines  Take or apply over-the-counter and prescription medicines only as told by your doctor.  If you were prescribed an antibiotic medicine, use it as told by your doctor. Do not stop taking the antibiotic even if you start to feel better. Skin Care  Apply cool, wet cloths (cool compresses) to the itchy, red, swollen areas.  Do not scratch your skin. Do not rub your skin. General instructions  Do not take hot showers or baths. This can make itching worse.  Do not wear tight clothes.  Use sunscreen and wear clothing that covers your skin when you are outside.  Avoid any triggers that cause your hives. Keep a journal to help you keep track of what causes your hives. Write down: ? What medicines you take. ? What you eat and drink. ? What products you use on your skin.  Keep all follow-up visits as told by your doctor. This is important. Contact a doctor if:  Your symptoms are not better with medicine.  Your joints are painful or swollen. Get help  right away if:  You have a fever.  You have belly pain.  Your tongue or lips are swollen.  Your eyelids are swollen.  Your chest or throat feels tight.  You have trouble breathing or swallowing. These symptoms may be an emergency. Do not wait to see if the symptoms will go away. Get medical help right away. Call your local emergency services (911 in the [place] Do not drive yourself to the hospital. This information is not intended to replace advice given to you by your health care provider. Make sure you discuss any questions you have with your health care provider. Document Released: 03/19/2008 Document Revised: 11/16/2015 Document Reviewed: 03/29/2015 Elsevier Interactive Patient Education  2018 [employer] NOW OFFER   [facility] FAST TRACK!!!  SAME DAY Appointments for ACUTE CARE  Such as: Sprains, Injuries, cuts, abrasions, rashes, muscle pain, joint pain, back pain Colds, flu, sore throats, headache, allergies, cough, fever  Ear pain, sinus and eye infections Abdominal pain, nausea, vomiting, diarrhea, upset stomach Animal/insect bites  3 Easy Ways to Schedule: Walk-In Scheduling Call in scheduling Mychart Sign-up: [email or web address]

## 2017-01-27 ENCOUNTER — Observation Stay (HOSPITAL_COMMUNITY)
Admission: EM | Admit: 2017-01-27 | Discharge: 2017-01-28 | Disposition: A | Discharge status: Home / Self Care | Payer: 59 | Attending: Surgery | Admitting: Surgery

## 2017-01-27 ENCOUNTER — Encounter (HOSPITAL_COMMUNITY)
Admission: EM | Disposition: A | Discharge status: Home / Self Care | Payer: Self-pay | Source: Home / Self Care | Attending: Emergency Medicine

## 2017-01-27 ENCOUNTER — Emergency Department (HOSPITAL_COMMUNITY): Payer: 59 | Admitting: Anesthesiology

## 2017-01-27 ENCOUNTER — Encounter: Payer: Self-pay | Admitting: Family Medicine

## 2017-01-27 ENCOUNTER — Telehealth: Payer: Self-pay | Admitting: Family Medicine

## 2017-01-27 ENCOUNTER — Encounter (HOSPITAL_COMMUNITY): Payer: Self-pay | Admitting: *Deleted

## 2017-01-27 ENCOUNTER — Ambulatory Visit (INDEPENDENT_AMBULATORY_CARE_PROVIDER_SITE_OTHER): Payer: 59 | Admitting: Family Medicine

## 2017-01-27 ENCOUNTER — Emergency Department (HOSPITAL_COMMUNITY): Payer: 59

## 2017-01-27 VITALS — BP 110/78 | HR 76 | Temp 98.5°F | Wt 162.0 lb

## 2017-01-27 DIAGNOSIS — K353 Acute appendicitis with localized peritonitis: Principal | ICD-10-CM | POA: Insufficient documentation

## 2017-01-27 DIAGNOSIS — K3580 Unspecified acute appendicitis: Secondary | ICD-10-CM | POA: Diagnosis present

## 2017-01-27 DIAGNOSIS — K352 Acute appendicitis with generalized peritonitis, without abscess: Secondary | ICD-10-CM

## 2017-01-27 DIAGNOSIS — Z79899 Other long term (current) drug therapy: Secondary | ICD-10-CM | POA: Insufficient documentation

## 2017-01-27 DIAGNOSIS — R109 Unspecified abdominal pain: Secondary | ICD-10-CM | POA: Diagnosis not present

## 2017-01-27 HISTORY — PX: LAPAROSCOPIC APPENDECTOMY: SHX408

## 2017-01-27 LAB — COMPREHENSIVE METABOLIC PANEL
ALK PHOS: 63 U/L (ref 38–126)
ALT: 21 U/L (ref 14–54)
ANION GAP: 10 (ref 5–15)
AST: 21 U/L (ref 15–41)
Albumin: 4.1 g/dL (ref 3.5–5.0)
BUN: 11 mg/dL (ref 6–20)
CALCIUM: 10.1 mg/dL (ref 8.9–10.3)
CO2: 23 mmol/L (ref 22–32)
CREATININE: 0.66 mg/dL (ref 0.44–1.00)
Chloride: 104 mmol/L (ref 101–111)
GFR calc Af Amer: >60 mL/min (ref >60)
Glucose, Bld: 110 mg/dL — ABNORMAL HIGH (ref 65–99)
Potassium: 3.9 mmol/L (ref 3.5–5.1)
Sodium: 137 mmol/L (ref 135–145)
Total Bilirubin: 0.9 mg/dL (ref 0.3–1.2)
Total Protein: 6.9 g/dL (ref 6.5–8.1)

## 2017-01-27 LAB — URINALYSIS, ROUTINE W REFLEX MICROSCOPIC
Bilirubin Urine: NEGATIVE
Glucose, UA: NEGATIVE mg/dL
Hgb urine dipstick: NEGATIVE
Ketones, ur: NEGATIVE mg/dL
LEUKOCYTES UA: NEGATIVE
NITRITE: NEGATIVE
PROTEIN: NEGATIVE mg/dL
Specific Gravity, Urine: 1.012 (ref 1.005–1.030)
pH: 8 (ref 5.0–8.0)

## 2017-01-27 LAB — CBC
HCT: 39.1 % (ref 36.0–46.0)
Hemoglobin: 13.2 g/dL (ref 12.0–15.0)
MCH: 29.8 pg (ref 26.0–34.0)
MCHC: 33.8 g/dL (ref 30.0–36.0)
MCV: 88.3 fL (ref 78.0–100.0)
PLATELETS: 187 10*3/uL (ref 150–400)
RBC: 4.43 MIL/uL (ref 3.87–5.11)
RDW: 12.7 % (ref 11.5–15.5)
WBC: 10.8 10*3/uL — AB (ref 4.0–10.5)

## 2017-01-27 LAB — LIPASE, BLOOD: LIPASE: 24 U/L (ref 11–51)

## 2017-01-27 LAB — POC URINALSYSI DIPSTICK (AUTOMATED)
Bilirubin, UA: NEGATIVE
GLUCOSE UA: NEGATIVE
Ketones, UA: NEGATIVE
Leukocytes, UA: NEGATIVE
NITRITE UA: NEGATIVE
PROTEIN UA: NEGATIVE
RBC UA: NEGATIVE
Spec Grav, UA: 1.015 (ref 1.010–1.025)
UROBILINOGEN UA: 0.2 U/dL
pH, UA: 7.5 (ref 5.0–8.0)

## 2017-01-27 SURGERY — APPENDECTOMY, LAPAROSCOPIC
Anesthesia: General | Site: Abdomen

## 2017-01-27 MED ORDER — DEXAMETHASONE SODIUM PHOSPHATE 10 MG/ML IJ SOLN
INTRAMUSCULAR | Status: DC | PRN
  Administered 2017-01-27: 10 mg via INTRAVENOUS

## 2017-01-27 MED ORDER — 0.9 % SODIUM CHLORIDE (POUR BTL) OPTIME
TOPICAL | Status: DC | PRN
  Administered 2017-01-27: 1000 mL

## 2017-01-27 MED ORDER — ROCURONIUM BROMIDE 100 MG/10ML IV SOLN
INTRAVENOUS | Status: DC | PRN
  Administered 2017-01-27: 40 mg via INTRAVENOUS

## 2017-01-27 MED ORDER — ONDANSETRON HCL 4 MG/2ML IJ SOLN
INTRAMUSCULAR | Status: DC | PRN
  Administered 2017-01-27: 4 mg via INTRAVENOUS

## 2017-01-27 MED ORDER — HYDROMORPHONE HCL 1 MG/ML IJ SOLN
INTRAMUSCULAR | Status: AC
  Filled 2017-01-27: qty 1

## 2017-01-27 MED ORDER — SUGAMMADEX SODIUM 200 MG/2ML IV SOLN
INTRAVENOUS | Status: DC | PRN
  Administered 2017-01-27: 200 mg via INTRAVENOUS

## 2017-01-27 MED ORDER — KETOROLAC TROMETHAMINE 30 MG/ML IJ SOLN
INTRAMUSCULAR | Status: DC | PRN
  Administered 2017-01-27: 30 mg via INTRAVENOUS

## 2017-01-27 MED ORDER — MIDAZOLAM HCL 5 MG/5ML IJ SOLN
INTRAMUSCULAR | Status: DC | PRN
  Administered 2017-01-27: 2 mg via INTRAVENOUS

## 2017-01-27 MED ORDER — FENTANYL CITRATE (PF) 100 MCG/2ML IJ SOLN
INTRAMUSCULAR | Status: DC | PRN
  Administered 2017-01-27 (×2): 50 ug via INTRAVENOUS

## 2017-01-27 MED ORDER — MORPHINE SULFATE (PF) 4 MG/ML IV SOLN
1.0000 mg | INTRAVENOUS | Status: DC | PRN
  Administered 2017-01-28: 2 mg via INTRAVENOUS
  Filled 2017-01-27: qty 1

## 2017-01-27 MED ORDER — SODIUM CHLORIDE 0.9 % IV BOLUS (SEPSIS)
1000.0000 mL | Freq: Once | INTRAVENOUS | Status: AC
  Administered 2017-01-27: 1000 mL via INTRAVENOUS

## 2017-01-27 MED ORDER — SODIUM CHLORIDE 0.9 % IR SOLN
Status: DC | PRN
  Administered 2017-01-27: 1000 mL

## 2017-01-27 MED ORDER — KETOROLAC TROMETHAMINE 30 MG/ML IJ SOLN: 30.0000 mg | Freq: Once | INTRAMUSCULAR | Status: DC | PRN

## 2017-01-27 MED ORDER — ENOXAPARIN SODIUM 40 MG/0.4ML ~~LOC~~ SOLN: 40.0000 mg | SUBCUTANEOUS | Status: DC

## 2017-01-27 MED ORDER — SUGAMMADEX SODIUM 200 MG/2ML IV SOLN
INTRAVENOUS | Status: AC
  Filled 2017-01-27: qty 2

## 2017-01-27 MED ORDER — SODIUM CHLORIDE 0.9 % IV SOLN
INTRAVENOUS | Status: DC
  Administered 2017-01-27: 16:00:00 via INTRAVENOUS

## 2017-01-27 MED ORDER — LIDOCAINE 2% (20 MG/ML) 5 ML SYRINGE
INTRAMUSCULAR | Status: AC
  Filled 2017-01-27: qty 10

## 2017-01-27 MED ORDER — BUPIVACAINE-EPINEPHRINE (PF) 0.5% -1:200000 IJ SOLN
INTRAMUSCULAR | Status: AC
  Filled 2017-01-27: qty 30

## 2017-01-27 MED ORDER — LIDOCAINE HCL (CARDIAC) 20 MG/ML IV SOLN
INTRAVENOUS | Status: DC | PRN
  Administered 2017-01-27: 60 mg via INTRAVENOUS

## 2017-01-27 MED ORDER — FENTANYL CITRATE (PF) 250 MCG/5ML IJ SOLN
INTRAMUSCULAR | Status: AC
  Filled 2017-01-27: qty 5

## 2017-01-27 MED ORDER — PROPOFOL 10 MG/ML IV BOLUS
INTRAVENOUS | Status: AC
  Filled 2017-01-27: qty 20

## 2017-01-27 MED ORDER — MIDAZOLAM HCL 2 MG/2ML IJ SOLN
INTRAMUSCULAR | Status: AC
  Filled 2017-01-27: qty 2

## 2017-01-27 MED ORDER — DIPHENHYDRAMINE HCL 25 MG PO CAPS: 25.0000 mg | ORAL_CAPSULE | Freq: Four times a day (QID) | ORAL | Status: DC | PRN

## 2017-01-27 MED ORDER — ROCURONIUM BROMIDE 10 MG/ML (PF) SYRINGE
PREFILLED_SYRINGE | INTRAVENOUS | Status: AC
  Filled 2017-01-27: qty 5

## 2017-01-27 MED ORDER — DEXTROSE 5 % IV SOLN
2.0000 g | Freq: Once | INTRAVENOUS | Status: AC
  Administered 2017-01-27: 2 g via INTRAVENOUS
  Filled 2017-01-27: qty 2

## 2017-01-27 MED ORDER — TETANUS-DIPHTH-ACELL PERTUSSIS 5-2.5-18.5 LF-MCG/0.5 IM SUSP
0.5000 mL | Freq: Once | INTRAMUSCULAR | Status: AC
  Administered 2017-01-27: 0.5 mL via INTRAMUSCULAR
  Filled 2017-01-27: qty 0.5

## 2017-01-27 MED ORDER — LACTATED RINGERS IV SOLN: INTRAVENOUS | Status: DC | PRN

## 2017-01-27 MED ORDER — DEXAMETHASONE SODIUM PHOSPHATE 10 MG/ML IJ SOLN
INTRAMUSCULAR | Status: AC
  Filled 2017-01-27: qty 1

## 2017-01-27 MED ORDER — DIPHENHYDRAMINE HCL 50 MG/ML IJ SOLN
25.0000 mg | Freq: Four times a day (QID) | INTRAMUSCULAR | Status: DC | PRN
Start: 2017-01-27 — End: 2017-01-28

## 2017-01-27 MED ORDER — METRONIDAZOLE IN NACL 5-0.79 MG/ML-% IV SOLN
500.0000 mg | Freq: Once | INTRAVENOUS | Status: AC
  Administered 2017-01-27: 500 mg via INTRAVENOUS
  Filled 2017-01-27: qty 100

## 2017-01-27 MED ORDER — PROGESTERONE MICRONIZED 100 MG PO CAPS
100.0000 mg | ORAL_CAPSULE | Freq: Every day | ORAL | Status: DC
  Filled 2017-01-27: qty 1

## 2017-01-27 MED ORDER — ONDANSETRON HCL 4 MG/2ML IJ SOLN: 4.0000 mg | Freq: Four times a day (QID) | INTRAMUSCULAR | Status: DC | PRN

## 2017-01-27 MED ORDER — ONDANSETRON 4 MG PO TBDP: 4.0000 mg | ORAL_TABLET | Freq: Four times a day (QID) | ORAL | Status: DC | PRN

## 2017-01-27 MED ORDER — DEXTROSE 5 % IV SOLN
INTRAVENOUS | Status: DC | PRN
  Administered 2017-01-27: 2 g via INTRAVENOUS

## 2017-01-27 MED ORDER — HYDROMORPHONE HCL 1 MG/ML IJ SOLN
1.0000 mg | Freq: Once | INTRAMUSCULAR | Status: AC
  Administered 2017-01-27: 1 mg via INTRAVENOUS
  Filled 2017-01-27: qty 1

## 2017-01-27 MED ORDER — HYDROMORPHONE HCL 1 MG/ML IJ SOLN
0.2500 mg | INTRAMUSCULAR | Status: DC | PRN
  Administered 2017-01-27 (×2): 0.5 mg via INTRAVENOUS

## 2017-01-27 MED ORDER — PROPOFOL 10 MG/ML IV BOLUS
INTRAVENOUS | Status: DC | PRN
  Administered 2017-01-27: 150 mg via INTRAVENOUS

## 2017-01-27 MED ORDER — BUPIVACAINE-EPINEPHRINE 0.5% -1:200000 IJ SOLN
INTRAMUSCULAR | Status: DC | PRN
  Administered 2017-01-27: 20 mL

## 2017-01-27 MED ORDER — ESTRADIOL 1 MG PO TABS: 0.5000 mg | ORAL_TABLET | Freq: Every day | ORAL | Status: DC

## 2017-01-27 MED ORDER — OXYCODONE HCL 5 MG PO TABS
5.0000 mg | ORAL_TABLET | ORAL | Status: DC | PRN
  Administered 2017-01-28: 5 mg via ORAL
  Filled 2017-01-27: qty 1

## 2017-01-27 MED ORDER — PROMETHAZINE HCL 25 MG/ML IJ SOLN: 6.2500 mg | INTRAMUSCULAR | Status: DC | PRN

## 2017-01-27 MED ORDER — POTASSIUM CHLORIDE IN NACL 20-0.9 MEQ/L-% IV SOLN
INTRAVENOUS | Status: DC
  Administered 2017-01-28: 01:00:00 via INTRAVENOUS
  Filled 2017-01-27: qty 1000

## 2017-01-27 MED ORDER — IOPAMIDOL (ISOVUE-300) INJECTION 61%
INTRAVENOUS | Status: AC
  Administered 2017-01-27: 100 mL
  Filled 2017-01-27: qty 100

## 2017-01-27 SURGICAL SUPPLY — 42 items
ADH SKN CLS APL DERMABOND .7 (GAUZE/BANDAGES/DRESSINGS) ×1
APPLIER CLIP 5 13 M/L LIGAMAX5 (MISCELLANEOUS)
APPLIER CLIP ROT 10 11.4 M/L (STAPLE)
APR CLP MED LRG 11.4X10 (STAPLE)
APR CLP MED LRG 5 ANG JAW (MISCELLANEOUS)
BAG SPEC RTRVL LRG 6X4 10 (ENDOMECHANICALS) ×1
CANISTER SUCT 3000ML PPV (MISCELLANEOUS) ×2 IMPLANT
CHLORAPREP W/TINT 26ML (MISCELLANEOUS) ×2 IMPLANT
CLIP APPLIE 5 13 M/L LIGAMAX5 (MISCELLANEOUS) IMPLANT
CLIP APPLIE ROT 10 11.4 M/L (STAPLE) IMPLANT
COVER SURGICAL LIGHT HANDLE (MISCELLANEOUS) ×2 IMPLANT
CUTTER FLEX LINEAR 45M (STAPLE) IMPLANT
DERMABOND ADVANCED (GAUZE/BANDAGES/DRESSINGS) ×1
DERMABOND ADVANCED .7 DNX12 (GAUZE/BANDAGES/DRESSINGS) ×1 IMPLANT
ELECT REM PT RETURN 9FT ADLT (ELECTROSURGICAL) ×2
ELECTRODE REM PT RTRN 9FT ADLT (ELECTROSURGICAL) ×1 IMPLANT
GLOVE SURG SIGNA 7.5 PF LTX (GLOVE) ×2 IMPLANT
GOWN STRL REUS W/ TWL LRG LVL3 (GOWN DISPOSABLE) ×2 IMPLANT
GOWN STRL REUS W/ TWL XL LVL3 (GOWN DISPOSABLE) ×1 IMPLANT
GOWN STRL REUS W/TWL LRG LVL3 (GOWN DISPOSABLE) ×4
GOWN STRL REUS W/TWL XL LVL3 (GOWN DISPOSABLE) ×2
KIT BASIN OR (CUSTOM PROCEDURE TRAY) ×2 IMPLANT
KIT ROOM TURNOVER OR (KITS) ×2 IMPLANT
NS IRRIG 1000ML POUR BTL (IV SOLUTION) ×2 IMPLANT
PAD ARMBOARD 7.5X6 YLW CONV (MISCELLANEOUS) ×4 IMPLANT
POUCH SPECIMEN RETRIEVAL 10MM (ENDOMECHANICALS) ×2 IMPLANT
RELOAD 45 VASCULAR/THIN (ENDOMECHANICALS) IMPLANT
RELOAD STAPLE 45 2.5 WHT GRN (ENDOMECHANICALS) IMPLANT
RELOAD STAPLE 45 3.5 BLU ETS (ENDOMECHANICALS) IMPLANT
RELOAD STAPLE TA45 3.5 REG BLU (ENDOMECHANICALS) ×2 IMPLANT
SET IRRIG TUBING LAPAROSCOPIC (IRRIGATION / IRRIGATOR) ×2 IMPLANT
SHEARS HARMONIC ACE PLUS 36CM (ENDOMECHANICALS) ×2 IMPLANT
SLEEVE ENDOPATH XCEL 5M (ENDOMECHANICALS) ×2 IMPLANT
SPECIMEN JAR SMALL (MISCELLANEOUS) ×2 IMPLANT
SUT MON AB 4-0 PC3 18 (SUTURE) ×2 IMPLANT
TOWEL OR 17X24 6PK STRL BLUE (TOWEL DISPOSABLE) ×2 IMPLANT
TOWEL OR 17X26 10 PK STRL BLUE (TOWEL DISPOSABLE) ×2 IMPLANT
TRAY LAPAROSCOPIC MC (CUSTOM PROCEDURE TRAY) ×2 IMPLANT
TROCAR BLADELESS 5MM (ENDOMECHANICALS) ×2 IMPLANT
TROCAR XCEL BLUNT TIP 100MML (ENDOMECHANICALS) ×2 IMPLANT
TROCAR XCEL NON-BLD 5MMX100MML (ENDOMECHANICALS) ×2 IMPLANT
TUBING INSUFFLATION (TUBING) ×2 IMPLANT

## 2017-01-27 NOTE — Patient Instructions (Signed)
Please seek care for evaluation at the emergency department.  It is important to seek care for imaging and lab work.   Abdominal Pain, Adult Abdominal pain can be caused by many things. Often, abdominal pain is not serious and it gets better with no treatment or by being treated at home. However, sometimes abdominal pain is serious. Your health care provider will do a medical history and a physical exam to try to determine the cause of your abdominal pain. Follow these instructions at home:  Take over-the-counter and prescription medicines only as told by your health care provider. Do not take a laxative unless told by your health care provider.  Drink enough fluid to keep your urine clear or pale yellow.  Watch your condition for any changes.  Keep all follow-up visits as told by your health care provider. This is important. Contact a health care provider if:  Your abdominal pain changes or gets worse.  You are not hungry or you lose weight without trying.  You are constipated or have diarrhea for more than 2-3 days.  You have pain when you urinate or have a bowel movement.  Your abdominal pain wakes you up at night.  Your pain gets worse with meals, after eating, or with certain foods.  You are throwing up and cannot keep anything down.  You have a fever. Get help right away if:  Your pain does not go away as soon as your health care provider told you to expect.  You cannot stop throwing up.  Your pain is only in areas of the abdomen, such as the right side or the left lower portion of the abdomen.  You have bloody or black stools, or stools that look like tar.  You have severe pain, cramping, or bloating in your abdomen.  You have signs of dehydration, such as: ? Dark urine, very little urine, or no urine. ? Cracked lips. ? Dry mouth. ? Sunken eyes. ? Sleepiness. ? Weakness. This information is not intended to replace advice given to you by your health care  provider. Make sure you discuss any questions you have with your health care provider. Document Released: 03/20/2005 Document Revised: 12/29/2015 Document Reviewed: 11/22/2015 Elsevier Interactive Patient Education  2017 [employer].

## 2017-01-27 NOTE — Telephone Encounter (Signed)
Pt states she is having sever abdominal pain since about 11 am last night. No fever, no nausea, pain is mid center behind belly button.   Pt declined appt until she can speak with a nurse.

## 2017-01-27 NOTE — Transfer of Care (Signed)
Immediate Anesthesia Transfer of Care Note  Patient: Sydney Padilla  Procedure(s) Performed: Procedure(s): APPENDECTOMY LAPAROSCOPIC (N/A)  Patient Location: PACU  Anesthesia Type:General  Level of Consciousness: awake, alert  and oriented  Airway & Oxygen Therapy: Patient Spontanous Breathing and Patient connected to nasal cannula oxygen  Post-op Assessment: Report given to RN, Post -op Vital signs reviewed and stable and Patient moving all extremities X 4  Post vital signs: Reviewed and stable  Last Vitals:  Vitals:   01/27/17 1515 01/27/17 1742  BP: 133/77 127/76  Pulse: 88 84  Resp:  17  Temp:      Last Pain:  Vitals:   01/27/17 1206  TempSrc: Oral         Complications: No apparent anesthesia complications

## 2017-01-27 NOTE — Anesthesia Procedure Notes (Signed)
Procedure Name: Intubation Date/Time: 01/27/2017 4:55 PM Performed by: Rejeana Brock L Pre-anesthesia Checklist: Patient identified, Emergency Drugs available, Suction available and Patient being monitored Patient Re-evaluated:Patient Re-evaluated prior to induction Oxygen Delivery Method: Circle System Utilized Preoxygenation: Pre-oxygenation with 100% oxygen Induction Type: IV induction Ventilation: Mask ventilation without difficulty Laryngoscope Size: Mac and 3 Grade View: Grade I Tube type: Oral Tube size: 7.0 mm Number of attempts: 1 Airway Equipment and Method: Stylet and Oral airway Placement Confirmation: ETT inserted through vocal cords under direct vision,  positive ETCO2 and breath sounds checked- equal and bilateral Secured at: 21 cm Tube secured with: Tape Dental Injury: Teeth and Oropharynx as per pre-operative assessment

## 2017-01-27 NOTE — Progress Notes (Signed)
Patient admitted from PACU to 6N04.Alert and oriented x4.VS stable, lap sites x3 clean,dry and intact. Denies pain at this time. Oriented to room and call bell.

## 2017-01-27 NOTE — H&P (Signed)
[facility] Surgery Consult/Admission Note  Sydney Padilla [birth year]  [id number].    Requesting MD: Dr. Kathrynn Humble Chief Complaint/Reason for Consult: appendicitis  HPI:   Pt is a 56 year old otherwise healthy female who presented to the [facility] ED with complaints of abdominal pain. Pain started around umbilicus last night around 10pm. Pain progressively worsened and migrated to RLQ overnight. Pain is severe, non-radiating, worse with laughing or touch, dilaudid helped. No associated symptoms. Pt denies fever, chills, nausea, vomiting, diarrhea. She had 2 normal BM's after the onset of symptoms. No blood in her stools. No urinary symptoms. Pt not on anticoagulation. Last meal was last night at 8PM. No hx of abdominal surgeries.   ED Course: WBC 10.8 CT abd/pel: acute appendicitis. No evidence for perforation or abscess at this time.   ROS:  Review of Systems  Constitutional: Negative for chills, diaphoresis and fever.  Respiratory: Negative for shortness of breath.   Cardiovascular: Negative for chest pain.  Gastrointestinal: Positive for abdominal pain. Negative for blood in stool, constipation, diarrhea, nausea and vomiting.  Genitourinary: Negative for dysuria and hematuria.  Skin: Negative for rash.  Neurological: Negative for dizziness and loss of consciousness.  All other systems reviewed and are negative.    Family History  Problem Relation Age of Onset  . Hypertension Mother   . Osteoarthritis Mother   . Heart disease Father        Psychologist, forensic  . Atrial fibrillation Father   . Diabetes Maternal Grandmother   . Hypertension Maternal Grandmother   . Stroke Maternal Grandmother   . Heart disease Maternal Grandfather   . Cancer Other        uterine    Past Medical History:  Diagnosis Date  . Migraine without aura since college   more regualr with menses, now in menopause and less frequent  . Tachycardia 2000   occasional use of Atenol    Past Surgical History:   Procedure Laterality Date  . WISDOM TOOTH EXTRACTION  age 10    Social History:  reports that she has never smoked. She has never used smokeless tobacco. She reports that she drinks alcohol. She reports that she does not use drugs.  Allergies: No Known Allergies   (Not in a hospital admission)  Blood pressure 121/78, pulse 92, temperature 98.5 F (36.9 C), temperature source Oral, resp. rate 18, height _0  (1.651 m), weight 162 lb (73.5 kg), last menstrual period 05/24/2012, SpO2 99 %.  Physical Exam  Constitutional: She is oriented to person, place, and time and well-developed, well-nourished, and in no distress. Vital signs are normal. No distress.  HENT:  Head: Normocephalic and atraumatic.  Nose: Nose normal.  Mouth/Throat: Oropharynx is clear and moist.  Eyes: Pupils are equal, round, and reactive to light. Conjunctivae and EOM are normal. Right eye exhibits no discharge. Left eye exhibits no discharge. No scleral icterus.  Neck: Normal range of motion. Neck supple.  Cardiovascular: Normal rate, normal heart sounds and intact distal pulses.  An irregular rhythm present. Exam reveals no gallop and no friction rub.   No murmur heard. Pulses:      Radial pulses are 2+ on the right side, and 2+ on the left side.       Posterior tibial pulses are 2+ on the right side, and 2+ on the left side.  Pulmonary/Chest: Effort normal and breath sounds normal. No respiratory distress. She has no decreased breath sounds. She has no wheezes. She has no rhonchi.  She has no rales.  Abdominal: Soft. Normal appearance and bowel sounds are normal. She exhibits no distension and no mass. There is tenderness in the right lower quadrant. There is tenderness at McBurney's point. There is no rebound and no guarding. No hernia.  Musculoskeletal: Normal range of motion. She exhibits no edema, tenderness or deformity.  Neurological: She is alert and oriented to person, place, and time. No cranial nerve  deficit (grossly intact).  Skin: Skin is warm and dry. No rash noted. She is not diaphoretic.  Psychiatric: Mood and affect normal.  Nursing note and vitals reviewed.   Results for orders placed or performed during the hospital encounter of 01/27/17 (from the past 48 hour(s))  Urinalysis, Routine w reflex microscopic     Status: Abnormal   Collection Time: 01/27/17 12:10 PM  Result Value Ref Range   Color, Urine YELLOW YELLOW   APPearance CLOUDY (A) CLEAR   Specific Gravity, Urine 1.012 1.005 - 1.030   pH 8.0 5.0 - 8.0   Glucose, UA NEGATIVE NEGATIVE mg/dL   Hgb urine dipstick NEGATIVE NEGATIVE   Bilirubin Urine NEGATIVE NEGATIVE   Ketones, ur NEGATIVE NEGATIVE mg/dL   Protein, ur NEGATIVE NEGATIVE mg/dL   Nitrite NEGATIVE NEGATIVE   Leukocytes, UA NEGATIVE NEGATIVE  Lipase, blood     Status: None   Collection Time: 01/27/17 12:22 PM  Result Value Ref Range   Lipase 24 11 - 51 U/L  Comprehensive metabolic panel     Status: Abnormal   Collection Time: 01/27/17 12:22 PM  Result Value Ref Range   Sodium 137 135 - 145 mmol/L   Potassium 3.9 3.5 - 5.1 mmol/L   Chloride 104 101 - 111 mmol/L   CO2 23 22 - 32 mmol/L   Glucose, Bld 110 (H) 65 - 99 mg/dL   BUN 11 6 - 20 mg/dL   Creatinine, Ser 0.66 0.44 - 1.00 mg/dL   Calcium 10.1 8.9 - 10.3 mg/dL   Total Protein 6.9 6.5 - 8.1 g/dL   Albumin 4.1 3.5 - 5.0 g/dL   AST 21 15 - 41 U/L   ALT 21 14 - 54 U/L   Alkaline Phosphatase 63 38 - 126 U/L   Total Bilirubin 0.9 0.3 - 1.2 mg/dL   GFR calc non Af Amer >60 >60 mL/min   GFR calc Af Amer >60 >60 mL/min    Comment: (NOTE) The eGFR has been calculated using the CKD EPI equation. This calculation has not been validated in all clinical situations. eGFR's persistently <60 mL/min signify possible Chronic Kidney Disease.    Anion gap 10 5 - 15  CBC     Status: Abnormal   Collection Time: 01/27/17 12:22 PM  Result Value Ref Range   WBC 10.8 (H) 4.0 - 10.5 K/uL   RBC 4.43 3.87 - 5.11  MIL/uL   Hemoglobin 13.2 12.0 - 15.0 g/dL   HCT 39.1 36.0 - 46.0 %   MCV 88.3 78.0 - 100.0 fL   MCH 29.8 26.0 - 34.0 pg   MCHC 33.8 30.0 - 36.0 g/dL   RDW 12.7 11.5 - 15.5 %   Platelets 187 150 - 400 K/uL   Ct Abdomen Pelvis W Contrast  Result Date: 01/27/2017 CLINICAL DATA:  56 y/o  F; abdominal pain since last night. EXAM: CT ABDOMEN AND PELVIS WITH CONTRAST TECHNIQUE: Multidetector CT imaging of the abdomen and pelvis was performed using the standard protocol following bolus administration of intravenous contrast. CONTRAST:  136m ISOVUE-300 IOPAMIDOL (ISOVUE-300)  INJECTION 61% COMPARISON:  10/25/2008 CT abdomen and pelvis. FINDINGS: Lower chest: No acute abnormality. Hepatobiliary: Subcentimeter cyst in right lobe of liver, otherwise no focal liver abnormality is seen. No gallstones, gallbladder wall thickening, or biliary dilatation. Pancreas: Unremarkable. No pancreatic ductal dilatation or surrounding inflammatory changes. Spleen: Normal in size without focal abnormality. Adrenals/Urinary Tract: Adrenal glands are unremarkable. Kidneys are normal, without renal calculi, focal lesion, or hydronephrosis. Bladder is unremarkable. Stomach/Bowel: The appendix measures up to 15 mm and demonstrates enhancing wall thickening compatible with acute appendicitis. Surrounding inflammatory changes in periappendiceal fat. No evidence for perforation or abscess at this time. Stomach, small bowel, and large bowel are otherwise unremarkable. Vascular/Lymphatic: Aortic atherosclerosis. No enlarged abdominal or pelvic lymph nodes. Reproductive: Uterus and bilateral adnexa are unremarkable. Other: No abdominal wall hernia or abnormality. No abdominopelvic ascites. Musculoskeletal: No acute or significant osseous findings. IMPRESSION: Acute appendicitis. No evidence for perforation or abscess at this time. These results were called by telephone at the time of interpretation on 01/27/2017 at 2:41 pm to PA [place] RUSSO ,  who verbally acknowledged these results. Electronically Signed   By: Kristine Garbe M.D.   On: 01/27/2017 14:42      Assessment/Plan  Appendicitis - OR today for lap appy  Admit, NPO, IV abx  Kalman Drape, [facility] 01/27/2017, 3:37 PM Pager: [phone] Consults: [phone] Mon-Fri 7:00 am-4:30 pm Sat-Sun 7:00 am-11:30 am

## 2017-01-27 NOTE — Telephone Encounter (Signed)
Pt in office being seen.

## 2017-01-27 NOTE — ED Provider Notes (Signed)
[facility] DEPT Provider Note   CSN: [id number] Arrival date & time: 01/27/17  1154     History   Chief Complaint Chief Complaint  Patient presents with  . Abdominal Pain    HPI Sydney Padilla is a 56 y.o. female w PMHx migraine, presenting with acute onset of constant abdominal pain that began last night. Patient states initially pain began in the epigastric region and has migrated to her lower abdomen, it is constant and dull and sharp with movement, radiatng to her low back. Patient was sent here from her PCP today for concern of appendicitis. She's had 2 normal bowel movements since last night. Reports associated heartburn and urinary urgency. She denies nausea, vomiting, decrease in appetite chest pain, shortness of breath, vaginal bleeding or discharge, dysuria. He states she took Advil last night without relief. No history of abdominal surgeries. She is currently in menopause.  The history is provided by the patient.    Past Medical History:  Diagnosis Date  . Migraine without aura since college   more regualr with menses, now in menopause and less frequent  . Tachycardia 2000   occasional use of Atenol    Patient Active Problem List   Diagnosis Date Noted  . Routine general medical examination at a health care facility 05/23/2014  . Backache 04/24/2010  . Migraine without aura 09/19/2008  . Palpitations 08/04/2008    Past Surgical History:  Procedure Laterality Date  . WISDOM TOOTH EXTRACTION  age 52    OB History    Gravida Para Term Preterm AB Living   0 0 0 0 0 0   SAB TAB Ectopic Multiple Live Births   0 0 0 0 0       Home Medications    Prior to Admission medications   Medication Sig Start Date End Date Taking? Authorizing Provider  atenolol (TENORMIN) 25 MG tablet Take 1 tablet (25 mg total) by mouth as needed (heart rate). 07/11/15   Dorena Cookey, MD  cetirizine (ZYRTEC) 10 MG tablet Take 10 mg by mouth every other day.    [provider]  cyclobenzaprine (FLEXERIL) 5 MG tablet Take 1 tablet (5 mg total) by mouth 3 (three) times daily as needed for muscle spasms. 07/11/15   Dorena Cookey, MD  estradiol (ESTRACE) 0.5 MG tablet Take 1 tablet (0.5 mg total) by mouth daily. 10/08/16   Kem Boroughs, FNP  fexofenadine (ALLEGRA) 180 MG tablet Take 180 mg by mouth daily.    [provider]  fluticasone (FLONASE) 50 MCG/ACT nasal spray Place 2 sprays into both nostrils daily. 05/23/14   Dorena Cookey, MD  progesterone (PROMETRIUM) 100 MG capsule Take 1 capsule (100 mg total) by mouth daily. 10/08/16   Kem Boroughs, FNP  triamcinolone cream (KENALOG) 0.1 % Apply 1 application topically 2 (two) times daily. 11/21/16   Delano Metz, FNP  zolmitriptan (ZOMIG-ZMT) 2.5 MG disintegrating tablet TAKE 1 TABLET(S) BY MOUTH AS NEEDED FOR HEADACHE 10/08/16   Kem Boroughs, FNP    Family History Family History  Problem Relation Age of Onset  . Hypertension Mother   . Osteoarthritis Mother   . Heart disease Father        Psychologist, forensic  . Atrial fibrillation Father   . Diabetes Maternal Grandmother   . Hypertension Maternal Grandmother   . Stroke Maternal Grandmother   . Heart disease Maternal Grandfather   . Cancer Other        uterine  Social History Social History  Substance Use Topics  . Smoking status: Never Smoker  . Smokeless tobacco: Never Used  . Alcohol use 0.0 - 0.5 oz/week     Allergies   Patient has no known allergies.   Review of Systems Review of Systems  Constitutional: Negative for appetite change, chills and fever.  HENT: Negative for trouble swallowing.   Eyes: Negative.   Respiratory: Negative for shortness of breath.   Cardiovascular: Negative for chest pain.  Gastrointestinal: Positive for abdominal pain. Negative for blood in stool, constipation, diarrhea, nausea and vomiting.  Genitourinary: Positive for urgency. Negative for dysuria, flank pain and hematuria.    Musculoskeletal: Positive for back pain.  Skin: Negative.   Allergic/Immunologic: Negative for immunocompromised state.  Neurological: Negative for headaches.     Physical Exam Updated Vital Signs BP 121/78 (BP Location: Right Arm)   Pulse 92   Temp 98.5 F (36.9 C) (Oral)   Resp 18   Ht 5\' 5"  (1.651 m)   Wt 73.5 kg (162 lb)   LMP 05/24/2012 (Approximate)   SpO2 99%   BMI 26.96 kg/m   Physical Exam  Constitutional: She appears well-developed and well-nourished.  Patient appears uncomfortable  HENT:  Head: Normocephalic and atraumatic.  Mouth/Throat: Oropharynx is clear and moist.  Eyes: Conjunctivae are normal.  Neck: Normal range of motion.  Cardiovascular: Normal rate, regular rhythm, normal heart sounds and intact distal pulses.  Exam reveals no friction rub.   No murmur heard. Pulmonary/Chest: Effort normal and breath sounds normal. No respiratory distress. She has no wheezes. She has no rales.  Abdominal: Soft. Normal appearance and bowel sounds are normal. There is tenderness in the right lower quadrant, epigastric area, periumbilical area, suprapubic area and left lower quadrant. There is rebound, guarding and tenderness at McBurney's point. There is no rigidity. No hernia.  Neurological: She is alert.  Skin: Skin is warm.  Psychiatric: She has a normal mood and affect. Her behavior is normal.  Nursing note and vitals reviewed.    ED Treatments / Results  Labs (all labs ordered are listed, but only abnormal results are displayed) Labs Reviewed  COMPREHENSIVE METABOLIC PANEL - Abnormal; Notable for the following:       Result Value   Glucose, Bld 110 (*)    All other components within normal limits  CBC - Abnormal; Notable for the following:    WBC 10.8 (*)    All other components within normal limits  URINALYSIS, ROUTINE W REFLEX MICROSCOPIC - Abnormal; Notable for the following:    APPearance CLOUDY (*)    All other components within normal limits   LIPASE, BLOOD    EKG  EKG Interpretation None       Radiology Ct Abdomen Pelvis W Contrast  Result Date: 01/27/2017 CLINICAL DATA:  56 y/o  F; abdominal pain since last night. EXAM: CT ABDOMEN AND PELVIS WITH CONTRAST TECHNIQUE: Multidetector CT imaging of the abdomen and pelvis was performed using the standard protocol following bolus administration of intravenous contrast. CONTRAST:  186mL ISOVUE-300 IOPAMIDOL (ISOVUE-300) INJECTION 61% COMPARISON:  10/25/2008 CT abdomen and pelvis. FINDINGS: Lower chest: No acute abnormality. Hepatobiliary: Subcentimeter cyst in right lobe of liver, otherwise no focal liver abnormality is seen. No gallstones, gallbladder wall thickening, or biliary dilatation. Pancreas: Unremarkable. No pancreatic ductal dilatation or surrounding inflammatory changes. Spleen: Normal in size without focal abnormality. Adrenals/Urinary Tract: Adrenal glands are unremarkable. Kidneys are normal, without renal calculi, focal lesion, or hydronephrosis. Bladder is unremarkable. Stomach/Bowel:  The appendix measures up to 15 mm and demonstrates enhancing wall thickening compatible with acute appendicitis. Surrounding inflammatory changes in periappendiceal fat. No evidence for perforation or abscess at this time. Stomach, small bowel, and large bowel are otherwise unremarkable. Vascular/Lymphatic: Aortic atherosclerosis. No enlarged abdominal or pelvic lymph nodes. Reproductive: Uterus and bilateral adnexa are unremarkable. Other: No abdominal wall hernia or abnormality. No abdominopelvic ascites. Musculoskeletal: No acute or significant osseous findings. IMPRESSION: Acute appendicitis. No evidence for perforation or abscess at this time. These results were called by telephone at the time of interpretation on 01/27/2017 at 2:41 pm to PA Martinique RUSSO , who verbally acknowledged these results. Electronically Signed   By: Kristine Garbe M.D.   On: 01/27/2017 14:42     Procedures Procedures (including critical care time)  Medications Ordered in ED Medications  cefTRIAXone (ROCEPHIN) 2 g in dextrose 5 % 50 mL IVPB (not administered)    And  metroNIDAZOLE (FLAGYL) IVPB 500 mg (not administered)  Tdap (BOOSTRIX) injection 0.5 mL (not administered)  HYDROmorphone (DILAUDID) injection 1 mg (1 mg Intravenous Given 01/27/17 1342)  sodium chloride 0.9 % bolus 1,000 mL (1,000 mLs Intravenous New Bag/Given 01/27/17 1342)  iopamidol (ISOVUE-300) 61 % injection (100 mLs  Contrast Given 01/27/17 1404)     Initial Impression / Assessment and Plan / ED Course  I have reviewed the triage vital signs and the nursing notes.  Pertinent labs & imaging results that were available during my care of the patient were reviewed by me and considered in my medical decision making (see chart for details).    Patient presenting with acute onset of abdominal pain. Patient with acute appendicitis without rupture on CT. Pt is hemodynamically stable, nontoxic. Pain managed with dilaudid. Pt not complaining of nausea. IVF given. Rocephin and Flagyl started. Consulted general surgery, Jackson Latino, PA-C, accepting admission.  Pt discussed with Dr. Kathrynn Humble.  Final Clinical Impressions(s) / ED Diagnoses   Final diagnoses:  Acute appendicitis with generalized peritonitis    New Prescriptions New Prescriptions   No medications on file     Russo, Martinique N, PA-C 01/27/17 [place], Ankit, MD 01/30/17 [id number]    Varney Biles, MD 01/30/17 1309
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
## 2017-01-27 NOTE — Anesthesia Preprocedure Evaluation (Addendum)
Anesthesia Evaluation  Patient identified by MRN, date of birth, ID band Patient awake    Reviewed: Allergy & Precautions, NPO status , Patient's Chart, lab work & pertinent test results  Airway Mallampati: II  TM Distance: >3 FB Neck ROM: Full    Dental no notable dental hx.    Pulmonary neg pulmonary ROS,    Pulmonary exam normal breath sounds clear to auscultation       Cardiovascular negative cardio ROS Normal cardiovascular exam Rhythm:Regular Rate:Normal     Neuro/Psych negative neurological ROS  negative psych ROS   GI/Hepatic negative GI ROS, Neg liver ROS,   Endo/Other  negative endocrine ROS  Renal/GU negative Renal ROS  negative genitourinary   Musculoskeletal negative musculoskeletal ROS (+)   Abdominal   Peds negative pediatric ROS (+)  Hematology negative hematology ROS (+)   Anesthesia Other Findings   Reproductive/Obstetrics negative OB ROS                             Anesthesia Physical Anesthesia Plan  ASA: I  Anesthesia Plan: General   Post-op Pain Management:    Induction: Intravenous  PONV Risk Score and Plan: 4 or greater and Ondansetron, Dexamethasone, Midazolam and Scopolamine patch - Pre-op  Airway Management Planned: Oral ETT  Additional Equipment:   Intra-op Plan:   Post-operative Plan: Extubation in OR  Informed Consent: I have reviewed the patients History and Physical, chart, labs and discussed the procedure including the risks, benefits and alternatives for the proposed anesthesia with the patient or authorized representative who has indicated his/her understanding and acceptance.   Dental advisory given  Plan Discussed with: CRNA and Surgeon  Anesthesia Plan Comments:       Anesthesia Quick Evaluation

## 2017-01-27 NOTE — Op Note (Signed)
Appendectomy, Lap, Procedure Note  Indications: The patient presented with a history of right-sided abdominal pain. A CT revealed findings consistent with acute appendicitis.  Pre-operative Diagnosis: acute appendicitis  Post-operative Diagnosis: Same  Surgeon: Coralie Keens A   Assistants: 0  Anesthesia: General endotracheal anesthesia  ASA Class: 2  Procedure Details  The patient was seen again in the Holding Room. The risks, benefits, complications, treatment options, and expected outcomes were discussed with the patient and/or family. The possibilities of reaction to medication, perforation of viscus, bleeding, recurrent infection, finding a normal appendix, the need for additional procedures, failure to diagnose a condition, and creating a complication requiring transfusion or operation were discussed. There was concurrence with the proposed plan and informed consent was obtained. The site of surgery was properly noted. The patient was taken to Operating Room, identified as Sydney Padilla and the procedure verified as Appendectomy. A Time Out was held and the above information confirmed.  The patient was placed in the supine position and general anesthesia was induced, along with placement of orogastric tube, Venodyne boots, and a Foley catheter. The abdomen was prepped and draped in a sterile fashion. A one centimeter infraumbilical incision was made.  The  midline fascia was incised with a #15 blade.  A Kelly clamp was used to confirm entrance into the peritoneal cavity.  A pursestring suture was passed around the incision with a 0 Vicryl.  The Hasson was introduced into the abdomen and the tails of the suture were used to hold the Hasson in place.   The pneumoperitoneum was then established to steady pressure of 15 mmHg.  Additional 5 mm cannulas then placed in the left lower quadrant of the abdomen and the right upper quadrant under direct visualization. A careful evaluation of  the entire abdomen was carried out. The patient was placed in Trendelenburg and left lateral decubitus position. The small intestines were retracted in the cephalad and left lateral direction away from the pelvis and right lower quadrant. The patient was found to have an enlarged and inflamed appendix that was extending into the pelvis. There was no evidence of perforation.  The appendix was carefully dissected. The appendix was was skeletonized with the harmonic scalpel.   The appendix was divided at its base using an endo-GIA stapler. Minimal appendiceal stump was left in place. There was no evidence of bleeding, leakage, or complication after division of the appendix. Irrigation was also performed and irrigate suctioned from the abdomen as well.  The umbilical port site was closed with the purse string suture. There was no residual palpable fascial defect.  The trocar site skin wounds were closed with 4-0 Monocryl. Skin glue was applied.  Instrument, sponge, and needle counts were correct at the conclusion of the case.   Findings: The appendix was found to be inflamed. There were not signs of necrosis.  There was not perforation. There was not abscess formation.  Estimated Blood Loss:  Minimal         Drains:none         Complications:  None; patient tolerated the procedure well.         Disposition: PACU - hemodynamically stable.         Condition: stable

## 2017-01-27 NOTE — Anesthesia Postprocedure Evaluation (Signed)
Anesthesia Post Note  Patient: Sydney Padilla  Procedure(s) Performed: Procedure(s) (LRB): APPENDECTOMY LAPAROSCOPIC (N/A)     Patient location during evaluation: PACU Anesthesia Type: General Level of consciousness: awake and alert Pain management: pain level controlled Vital Signs Assessment: post-procedure vital signs reviewed and stable Respiratory status: spontaneous breathing, nonlabored ventilation, respiratory function stable and patient connected to nasal cannula oxygen Cardiovascular status: blood pressure returned to baseline and stable Postop Assessment: no signs of nausea or vomiting Anesthetic complications: no    Last Vitals:  Vitals:   01/27/17 1515 01/27/17 1742  BP: 133/77 127/76  Pulse: 88 84  Resp:  17  Temp:  (!) 36.4 C    Last Pain:  Vitals:   01/27/17 1742  TempSrc:   PainSc: 6                  Jeryn Cerney S

## 2017-01-27 NOTE — ED Triage Notes (Signed)
Patient comes in per [facility] with abd pain. Fm [facility]. abd pain, cramping. Rebound tenderness. More comfortable with knees up towards chest. Concern for appendicitis. SR ekg. 20 RFA. Denies n/v/d. Ems v/s 136/76, 74 HR, 100 RA, 16 RR.

## 2017-01-28 ENCOUNTER — Telehealth: Payer: Self-pay | Admitting: Family Medicine

## 2017-01-28 ENCOUNTER — Encounter (HOSPITAL_COMMUNITY): Payer: Self-pay | Admitting: Surgery

## 2017-01-28 MED ORDER — OXYCODONE HCL 5 MG PO TABS: 5.0000 mg | ORAL_TABLET | ORAL | 0 refills | Status: DC | PRN

## 2017-01-28 NOTE — Telephone Encounter (Signed)
FYI  Pt is calling to let julia know she made the right call concerning her appendix and they was removed yesterday and [place] released her today. Pt is feeling good

## 2017-01-28 NOTE — Discharge Instructions (Signed)

## 2017-01-28 NOTE — Telephone Encounter (Signed)
Spoke with patients husband states that patient is feeling better and is thankful.

## 2017-01-28 NOTE — Discharge Summary (Signed)
[facility]/Trauma Discharge Summary   Patient ID: Sydney Padilla MRN: [id number] DOB/AGE: 55-Jun-1962 55 y.o.  Admit date: 01/27/2017 Discharge date: 01/28/2017  Admitting Diagnosis: appendicitis  Discharge Diagnosis Patient Active Problem List   Diagnosis Date Noted  . Acute appendicitis 01/27/2017  . Routine general medical examination at a health care facility 05/23/2014  . Backache 04/24/2010  . Migraine without aura 09/19/2008  . Palpitations 08/04/2008    Consultants none  Imaging: Ct Abdomen Pelvis W Contrast  Result Date: 01/27/2017 CLINICAL DATA:  56 y/o  F; abdominal pain since last night. EXAM: CT ABDOMEN AND PELVIS WITH CONTRAST TECHNIQUE: Multidetector CT imaging of the abdomen and pelvis was performed using the standard protocol following bolus administration of intravenous contrast. CONTRAST:  147mL ISOVUE-300 IOPAMIDOL (ISOVUE-300) INJECTION 61% COMPARISON:  10/25/2008 CT abdomen and pelvis. FINDINGS: Lower chest: No acute abnormality. Hepatobiliary: Subcentimeter cyst in right lobe of liver, otherwise no focal liver abnormality is seen. No gallstones, gallbladder wall thickening, or biliary dilatation. Pancreas: Unremarkable. No pancreatic ductal dilatation or surrounding inflammatory changes. Spleen: Normal in size without focal abnormality. Adrenals/Urinary Tract: Adrenal glands are unremarkable. Kidneys are normal, without renal calculi, focal lesion, or hydronephrosis. Bladder is unremarkable. Stomach/Bowel: The appendix measures up to 15 mm and demonstrates enhancing wall thickening compatible with acute appendicitis. Surrounding inflammatory changes in periappendiceal fat. No evidence for perforation or abscess at this time. Stomach, small bowel, and large bowel are otherwise unremarkable. Vascular/Lymphatic: Aortic atherosclerosis. No enlarged abdominal or pelvic lymph nodes. Reproductive: Uterus and bilateral adnexa are unremarkable. Other: No abdominal  wall hernia or abnormality. No abdominopelvic ascites. Musculoskeletal: No acute or significant osseous findings. IMPRESSION: Acute appendicitis. No evidence for perforation or abscess at this time. These results were called by telephone at the time of interpretation on 01/27/2017 at 2:41 pm to PA [place] RUSSO , who verbally acknowledged these results. Electronically Signed   By: Kristine Garbe M.D.   On: 01/27/2017 14:42    Procedures Dr. Ninfa Linden (01/27/17) - Laparoscopic Appendectomy  Hospital Course:  Sydney Padilla is a 56 year old female who presented to [facility] with abdominal pain.  Workup showed appendicitis.  Patient was admitted and underwent procedure listed above.  Tolerated procedure well and was transferred to the floor.  Diet was advanced as tolerated.  On POD#1, the patient was voiding well, tolerating diet, ambulating well, pain well controlled, vital signs stable, incisions c/d/i and felt stable for discharge home.  Patient will follow up in our office in 2 weeks and knows to call with questions or concerns.  She will call to confirm appointment date/time.    Patient was discharged in good condition.  The [place] Substance controlled database was reviewed prior to prescribing narcotic pain medication to this patient.  Physical Exam: General:  Alert, NAD, pleasant, cooperative Cardio: RRR, S1 & S2 normal, no murmur, rubs, gallops Resp: Effort normal, lungs CTA bilaterally, no wheezes, rales, rhonchi Abd:  Soft, ND, normal bowel sounds, incisions with glue intact and mild surrounding ecchymosis, no TTP  Skin: warm and dry, no rashes noted  Allergies as of 01/28/2017   No Known Allergies     Medication List    TAKE these medications   atenolol 25 MG tablet Commonly known as:  TENORMIN Take 1 tablet (25 mg total) by mouth as needed (heart rate).   cetirizine 10 MG tablet Commonly known as:  ZYRTEC Take 10 mg by mouth at bedtime.   cyclobenzaprine 5 MG  tablet Commonly known  as:  FLEXERIL Take 1 tablet (5 mg total) by mouth 3 (three) times daily as needed for muscle spasms.   estradiol 0.5 MG tablet Commonly known as:  ESTRACE Take 1 tablet (0.5 mg total) by mouth daily.   fexofenadine 180 MG tablet Commonly known as:  ALLEGRA Take 180 mg by mouth daily.   fluticasone 50 MCG/ACT nasal spray Commonly known as:  FLONASE Place 2 sprays into both nostrils daily.   oxyCODONE 5 MG immediate release tablet Commonly known as:  Oxy IR/ROXICODONE Take 1 tablet (5 mg total) by mouth every 4 (four) hours as needed for moderate pain.   progesterone 100 MG capsule Commonly known as:  PROMETRIUM Take 1 capsule (100 mg total) by mouth daily.   triamcinolone cream 0.1 % Commonly known as:  KENALOG Apply 1 application topically 2 (two) times daily.   zolmitriptan 2.5 MG disintegrating tablet Commonly known as:  ZOMIG-ZMT TAKE 1 TABLET(S) BY MOUTH AS NEEDED FOR HEADACHE        Follow-up Information    [facility] Call.   Specialty:  General Surgery Why:  to confirm appointment date and time Contact information: [address] [place] [phone]          Signed: [facility] 01/28/2017, 8:37 AM Pager: [phone] Consults: [phone] Mon-Fri 7:00 am-4:30 pm Sat-Sun 7:00 am-11:30 am

## 2017-04-25 ENCOUNTER — Other Ambulatory Visit: Payer: Self-pay | Admitting: Obstetrics & Gynecology

## 2017-04-25 DIAGNOSIS — Z139 Encounter for screening, unspecified: Secondary | ICD-10-CM

## 2017-05-26 ENCOUNTER — Ambulatory Visit
Admission: RE | Admit: 2017-05-26 | Discharge: 2017-05-26 | Disposition: A | Discharge status: Home / Self Care | Payer: 59 | Source: Ambulatory Visit | Attending: Obstetrics & Gynecology | Admitting: Obstetrics & Gynecology

## 2017-05-26 DIAGNOSIS — Z139 Encounter for screening, unspecified: Secondary | ICD-10-CM

## 2017-10-09 ENCOUNTER — Ambulatory Visit: Payer: 59 | Admitting: Certified Nurse Midwife

## 2017-10-13 ENCOUNTER — Ambulatory Visit: Payer: 59 | Admitting: Nurse Practitioner

## 2017-10-23 ENCOUNTER — Ambulatory Visit (INDEPENDENT_AMBULATORY_CARE_PROVIDER_SITE_OTHER): Payer: 59 | Admitting: Certified Nurse Midwife

## 2017-10-23 ENCOUNTER — Encounter: Payer: Self-pay | Admitting: Certified Nurse Midwife

## 2017-10-23 ENCOUNTER — Other Ambulatory Visit: Payer: Self-pay

## 2017-10-23 VITALS — BP 108/68 | HR 64 | Resp 16 | Ht 63.75 in | Wt 159.0 lb

## 2017-10-23 DIAGNOSIS — N951 Menopausal and female climacteric states: Secondary | ICD-10-CM | POA: Diagnosis not present

## 2017-10-23 DIAGNOSIS — Z01419 Encounter for gynecological examination (general) (routine) without abnormal findings: Secondary | ICD-10-CM | POA: Diagnosis not present

## 2017-10-23 DIAGNOSIS — G43009 Migraine without aura, not intractable, without status migrainosus: Secondary | ICD-10-CM

## 2017-10-23 DIAGNOSIS — Z79890 Hormone replacement therapy (postmenopausal): Secondary | ICD-10-CM

## 2017-10-23 MED ORDER — ESTRADIOL 0.5 MG PO TABS: 0.5000 mg | ORAL_TABLET | Freq: Every day | ORAL | 12 refills | Status: DC

## 2017-10-23 MED ORDER — PROGESTERONE MICRONIZED 100 MG PO CAPS: 100.0000 mg | ORAL_CAPSULE | Freq: Every day | ORAL | 12 refills | Status: DC

## 2017-10-23 MED ORDER — ZOLMITRIPTAN 2.5 MG PO TBDP: ORAL_TABLET | ORAL | 6 refills | Status: DC

## 2017-10-23 NOTE — Progress Notes (Signed)
57 y.o. G0P0000 Married  Caucasian Fe here for annual exam. Menopausal on HRT, working well. Denies vaginal bleeding. Denies hot flashes or night sweats, no migraine issues, libido is "great", no vaginal dryness, no insomnia.Marland Kitchen Has noted that emotions are changing with some memory changes. Has very high stress job. Worried that her hormones need to be cyclic. Patient feels she does not sleep enough with long days and working on computer after she returns home. Has worried about short term memory loss with names she should remember, but not problems with directions or clothing or keys. Mother diagnosed with Alzheimer's and patient trying to help her now. Concerns for self regarding this occurring later in life. Sees PCP for aex, labs, has appointment in 8/19. zomig still working for migraine avoidance, uses about 1-2 times monthly. No auras with headache or change in intensity. Flies a lot for work and this can stimulate headache. No other health issues today.   Patient's last menstrual period was 05/24/2012 (approximate).          Sexually active: Yes.    The current method of family planning is post menopausal status.    Exercising: Yes.    walking Smoker:  no  Health Maintenance: Pap:  09-01-15 neg HPV HR neg History of Abnormal Pap: no MMG:  05-26-17 category c density birads 1:neg Self Breast exams: occ Colonoscopy:  None  BMD:   2011 TDaP:  2018 Shingles: no Pneumonia: no Hep C and HIV: hep c maybe done with pcp 2017 Labs: no   reports that she has never smoked. She has never used smokeless tobacco. She reports that she drinks alcohol. She reports that she does not use drugs.  Past Medical History:  Diagnosis Date  . Migraine without aura since college   more regualr with menses, now in menopause and less frequent  . Tachycardia 2000   occasional use of Atenol    Past Surgical History:  Procedure Laterality Date  . LAPAROSCOPIC APPENDECTOMY N/A 01/27/2017   Procedure: APPENDECTOMY  LAPAROSCOPIC;  Surgeon: Coralie Keens, MD;  Location: [facility];  Service: General;  Laterality: N/A;  . WISDOM TOOTH EXTRACTION  age 44    Current Outpatient Medications  Medication Sig Dispense Refill  . atenolol (TENORMIN) 25 MG tablet Take 1 tablet (25 mg total) by mouth as needed (heart rate). (Patient not taking: Reported on 01/27/2017) 25 tablet 3  . cetirizine (ZYRTEC) 10 MG tablet Take 10 mg by mouth at bedtime.     . cyclobenzaprine (FLEXERIL) 5 MG tablet Take 1 tablet (5 mg total) by mouth 3 (three) times daily as needed for muscle spasms. (Patient not taking: Reported on 01/27/2017) 30 tablet 2  . estradiol (ESTRACE) 0.5 MG tablet Take 1 tablet (0.5 mg total) by mouth daily. 90 tablet 4  . fexofenadine (ALLEGRA) 180 MG tablet Take 180 mg by mouth daily.    . fluticasone (FLONASE) 50 MCG/ACT nasal spray Place 2 sprays into both nostrils daily. (Patient not taking: Reported on 01/27/2017) 16 g 6  . oxyCODONE (OXY IR/ROXICODONE) 5 MG immediate release tablet Take 1 tablet (5 mg total) by mouth every 4 (four) hours as needed for moderate pain. 15 tablet 0  . progesterone (PROMETRIUM) 100 MG capsule Take 1 capsule (100 mg total) by mouth daily. 90 capsule 4  . triamcinolone cream (KENALOG) 0.1 % Apply 1 application topically 2 (two) times daily. (Patient not taking: Reported on 01/27/2017) 30 g 0  . zolmitriptan (ZOMIG-ZMT) 2.5 MG disintegrating tablet TAKE  1 TABLET(S) BY MOUTH AS NEEDED FOR HEADACHE 6 tablet 6   No current facility-administered medications for this visit.     Family History  Problem Relation Age of Onset  . Hypertension Mother   . Osteoarthritis Mother   . Heart disease Father        Psychologist, forensic  . Atrial fibrillation Father   . Diabetes Maternal Grandmother   . Hypertension Maternal Grandmother   . Stroke Maternal Grandmother   . Heart disease Maternal Grandfather   . Cancer Other        uterine  . Breast cancer Neg Hx     ROS:  Pertinent items are noted in HPI.   Otherwise, a comprehensive ROS was negative.  Exam:   LMP 05/24/2012 (Approximate)    Ht Readings from Last 3 Encounters:  01/27/17 5\' 5"  (1.651 m)  10/08/16 5\' 4"  (1.626 m)  11/23/15 5\' 4"  (1.626 m)    General appearance: alert, cooperative and appears stated age Head: Normocephalic, without obvious abnormality, atraumatic Neck: no adenopathy, supple, symmetrical, trachea midline and thyroid normal to inspection and palpation Lungs: clear to auscultation bilaterally Breasts: normal appearance, no masses or tenderness, No nipple retraction or dimpling, No nipple discharge or bleeding, No axillary or supraclavicular adenopathy Heart: regular rate and rhythm Abdomen: soft, non-tender; no masses,  no organomegaly Extremities: extremities normal, atraumatic, no cyanosis or edema Skin: Skin color, texture, turgor normal. No rashes or lesions Lymph nodes: Cervical, supraclavicular, and axillary nodes normal. No abnormal inguinal nodes palpated Neurologic: Grossly normal   Pelvic: External genitalia:  no lesions, normal appearance,               Urethra:  normal appearing urethra with no masses, tenderness or lesions              Bartholin's and Skene's: normal                 Vagina: normal appearing vagina with normal color and discharge, no lesions, no atrophy              Cervix: no cervical motion tenderness, no lesions and normal appearance              Pap taken: No. Bimanual Exam:  Uterus:  normal size, contour, position, consistency, mobility, non-tender              Adnexa: normal adnexa and no mass, fullness, tenderness               Rectovaginal: Confirms               Anus:  normal sphincter tone, no lesions  Chaperone present: yes  A:  Well Woman with normal exam  Menopausal on HRT, desires continuance  History of migraine headaches Zomig working well  Tachycardia with PCP management  ? Memory changes, mother recently diagnosed with Alzhiemer's , feel related to sleep  and stress level   P:   Reviewed health and wellness pertinent to exam  Discussed risks/benefits/warnig signs with HRT, desires continuance with no change after discussion   Rx Prometrium see order with instructions  Rx Estrace see order with instructions  Warning signs given with headache  Rx Zomig see order with instructions  Discussed understanding concerns with memory changes, discussed referral to Neurology for evaluation, patient declined, will work on decrease stress and sleep changes to see if improves and will discuss with PCP if needed.  Pap smear: no   counseled on breast self  exam, mammography screening, feminine hygiene, use and side effects of HRT, adequate intake of calcium and vitamin D, diet and exercise  return annually or prn  An After Visit Summary was printed and given to the patient.

## 2017-10-23 NOTE — Patient Instructions (Signed)
EXERCISE AND DIET:  We recommended that you start or continue a regular exercise program for good health. Regular exercise means any activity that makes your heart beat faster and makes you sweat.  We recommend exercising at least 30 minutes per day at least 3 days a week, preferably 4 or 5.  We also recommend a diet low in fat and sugar.  Inactivity, poor dietary choices and obesity can cause diabetes, heart attack, stroke, and kidney damage, among others.    ALCOHOL AND SMOKING:  Women should limit their alcohol intake to no more than 7 drinks/beers/glasses of wine (combined, not each!) per week. Moderation of alcohol intake to this level decreases your risk of breast cancer and liver damage. And of course, no recreational drugs are part of a healthy lifestyle.  And absolutely no smoking or even second hand smoke. Most people know smoking can cause heart and lung diseases, but did you know it also contributes to weakening of your bones? Aging of your skin?  Yellowing of your teeth and nails?  CALCIUM AND VITAMIN D:  Adequate intake of calcium and Vitamin D are recommended.  The recommendations for exact amounts of these supplements seem to change often, but generally speaking 600 mg of calcium (either carbonate or citrate) and 800 units of Vitamin D per day seems prudent. Certain women may benefit from higher intake of Vitamin D.  If you are among these women, your doctor will have told you during your visit.    PAP SMEARS:  Pap smears, to check for cervical cancer or precancers,  have traditionally been done yearly, although recent scientific advances have shown that most women can have pap smears less often.  However, every woman still should have a physical exam from her gynecologist every year. It will include a breast check, inspection of the vulva and vagina to check for abnormal growths or skin changes, a visual exam of the cervix, and then an exam to evaluate the size and shape of the uterus and  ovaries.  And after 56 years of age, a rectal exam is indicated to check for rectal cancers. We will also provide age appropriate advice regarding health maintenance, like when you should have certain vaccines, screening for sexually transmitted diseases, bone density testing, colonoscopy, mammograms, etc.   MAMMOGRAMS:  All women over 40 years old should have a yearly mammogram. Many facilities now offer a "3D" mammogram, which may cost around $50 extra out of pocket. If possible,  we recommend you accept the option to have the 3D mammogram performed.  It both reduces the number of women who will be called back for extra views which then turn out to be normal, and it is better than the routine mammogram at detecting truly abnormal areas.    COLONOSCOPY:  Colonoscopy to screen for colon cancer is recommended for all women at age 50.  We know, you hate the idea of the prep.  We agree, BUT, having colon cancer and not knowing it is worse!!  Colon cancer so often starts as a polyp that can be seen and removed at colonscopy, which can quite literally save your life!  And if your first colonoscopy is normal and you have no family history of colon cancer, most women don't have to have it again for 10 years.  Once every ten years, you can do something that may end up saving your life, right?  We will be happy to help you get it scheduled when you are ready.    Be sure to check your insurance coverage so you understand how much it will cost.  It may be covered as a preventative service at no cost, but you should check your particular policy.      Menopause and Hormone Replacement Therapy What is hormone replacement therapy? Hormone replacement therapy (HRT) is the use of artificial (synthetic) hormones to replace hormones that your body stops producing during menopause. Menopause is the normal time of life when menstrual periods stop completely and the ovaries stop producing the female hormones estrogen and  progesterone. This lack of hormones can affect your health and cause undesirable symptoms. HRT can relieve some of those symptoms. What are my options for HRT? HRT may consist of the synthetic hormones estrogen and progestin, or it may consist of only estrogen (estrogen-only therapy). You and your health care provider will decide which form of HRT is best for you. If you choose to be on HRT and you have a uterus, estrogen and progestin are usually prescribed. Estrogen-only therapy is used for women who do not have a uterus. Possible options for taking HRT include:  Pills.  Patches.  Gels.  Sprays.  Vaginal cream.  Vaginal rings.  Vaginal inserts.  The amount of hormone(s) that you take and how long you take the hormone(s) varies depending on your individual health. It is important to:  Begin HRT with the lowest possible dosage.  Stop HRT as soon as your health care provider tells you to stop.  Work with your health care provider so that you feel informed and comfortable with your decisions.  What are the benefits of HRT? HRT can reduce the frequency and severity of menopausal symptoms. Benefits of HRT vary depending on the menopausal symptoms that you have, the severity of your symptoms, and your overall health. HRT may help to improve the following menopausal symptoms:  Hot flashes and night sweats. These are sudden feelings of heat that spread over the face and body. The skin may turn red, like a blush. Night sweats are hot flashes that happen while you are sleeping or trying to sleep.  Bone loss (osteoporosis). The body loses calcium more quickly after menopause, causing the bones to become weaker. This can increase the risk for bone breaks (fractures).  Vaginal dryness. The lining of the vagina can become thin and dry, which can cause pain during sexual intercourse or cause infection, burning, or itching.  Urinary tract infections.  Urinary incontinence. This is a  decreased ability to control when you urinate.  Irritability.  Short-term memory problems.  What are the risks of HRT? Risks of HRT vary depending on your individual health and medical history. Risks of HRT also depend on whether you receive both estrogen and progestin or you receive estrogen only.HRT may increase the risk of:  Spotting. This is when a small amount of bloodleaks from the vagina unexpectedly.  Endometrial cancer. This cancer is in the lining of the uterus (endometrium).  Breast cancer.  Increased density of breast tissue. This can make it harder to find breast cancer on a breast X-ray (mammogram).  Stroke.  Heart attack.  Blood clots.  Gallbladder disease.  Risks of HRT can increase if you have any of the following conditions:  Endometrial cancer.  Liver disease.  Heart disease.  Breast cancer.  History of blood clots.  History of stroke.  How should I care for myself while I am on HRT?  Take over-the-counter and prescription medicines only as told by your health care   provider.  Get mammograms, pelvic exams, and medical checkups as often as told by your health care provider.  Have Pap tests done as often as told by your health care provider. A Pap test is sometimes called a Pap smear. It is a screening test that is used to check for signs of cancer of the cervix and vagina. A Pap test can also identify the presence of infection or precancerous changes. Pap tests may be done: ? Every 3 years, starting at age 21. ? Every 5 years, starting after age 30, in combination with testing for human papillomavirus (HPV). ? More often or less often depending on other medical conditions you have, your age, and other risk factors.  It is your responsibility to get your Pap test results. Ask your health care provider or the department performing the test when your results will be ready.  Keep all follow-up visits as told by your health care provider. This is  important. When should I seek medical care? Talk with your health care provider if:  You have any of these: ? Pain or swelling in your legs. ? Shortness of breath. ? Chest pain. ? Lumps or changes in your breasts or armpits. ? Slurred speech. ? Pain, burning, or bleeding when you urine.  You develop any of these: ? Unusual vaginal bleeding. ? Dizziness or headaches. ? Weakness or numbness in any part of your arms or legs. ? Pain in your abdomen.  This information is not intended to replace advice given to you by your health care provider. Make sure you discuss any questions you have with your health care provider. Document Released: 03/09/2003 Document Revised: 05/07/2016 Document Reviewed: 12/12/2014 Elsevier Interactive Patient Education  2017 [employer]vier Inc.  

## 2018-03-18 ENCOUNTER — Ambulatory Visit (INDEPENDENT_AMBULATORY_CARE_PROVIDER_SITE_OTHER): Payer: 59 | Admitting: Family Medicine

## 2018-03-18 ENCOUNTER — Telehealth: Payer: Self-pay | Admitting: Family Medicine

## 2018-03-18 ENCOUNTER — Encounter: Payer: Self-pay | Admitting: Family Medicine

## 2018-03-18 ENCOUNTER — Ambulatory Visit: Payer: Self-pay

## 2018-03-18 VITALS — BP 120/78 | HR 69 | Temp 98.5°F

## 2018-03-18 DIAGNOSIS — M545 Low back pain, unspecified: Secondary | ICD-10-CM | POA: Insufficient documentation

## 2018-03-18 HISTORY — DX: Low back pain, unspecified: M54.50

## 2018-03-18 MED ORDER — CYCLOBENZAPRINE HCL 5 MG PO TABS: ORAL_TABLET | ORAL | 2 refills | Status: DC

## 2018-03-18 NOTE — Telephone Encounter (Signed)
Copied from [place] [id number]. Topic: Quick Communication - Rx Refill/Question >> Mar 18, 2018  9:13 AM Cecelia Byars, NT wrote: Medication: cyclobenzaprine (FLEXERIL) 5 MG tablet  Has the patient contacted their pharmacy? yes  (Agent: If no, request that the patient contact the pharmacy for the refill. (Agent: If yes, when and what did the pharmacy advise?  Preferred Pharmacy (with phone number or street name [facility] [address] [place] - [place] [phone] (Phone) [phone] (Fax)  Patient is having problems with  back pain from  this weekend .Marland Kitchen  Agent: Please be advised that RX refills may take up to 3 business days. We ask that you follow-up with your pharmacy.

## 2018-03-18 NOTE — Telephone Encounter (Signed)
Returned call to pt.  She requested refill on Flexeril.  C/o low back pain since Sunday, after water skiing.  Reported she thinks she "tweeked" her back.  Denied any radiation of the pain; denied numbness or weakness into LE's.  Stated her pain starts out mild in the morning and becomes "moderate" as the day progresses.  Has been using Advil, previous Rx of Flexeril, and alternating heat and ice.  Appt. scheduled for today at 11:15 AM.  Pt. Agrees with plan.   Reason for Disposition . [1] MODERATE back pain (e.g., interferes with normal activities) AND [2] present > 3 days  Answer Assessment - Initial Assessment Questions 1. ONSET: "When did the pain begin?"      Sunday 2. LOCATION: "Where does it hurt?" (upper, mid or lower back)     Lower back 3. SEVERITY: "How bad is the pain?"  (e.g., Scale 1-10; mild, moderate, or severe)   - MILD (1-3): doesn't interfere with normal activities    - MODERATE (4-7): interferes with normal activities or awakens from sleep    - SEVERE (8-10): excruciating pain, unable to do any normal activities      Moderate as day progresses  4. PATTERN: "Is the pain constant?" (e.g., yes, no; constant, intermittent)      Constant 5. RADIATION: "Does the pain shoot into your legs or elsewhere?"     denied 6. CAUSE:  "What do you think is causing the back pain?"      Water skiing on Sunday 7. BACK OVERUSE:  "Any recent lifting of heavy objects, strenuous work or exercise?"     Yes-water skiing 8. MEDICATIONS: "What have you taken so far for the pain?" (e.g., nothing, acetaminophen, NSAIDS)     Advil , Flexeril, and alternating heat and ice 9. NEUROLOGIC SYMPTOMS: "Do you have any weakness, numbness, or problems with bowel/bladder control?"     Denied any problems with bladder / bowel; denied numbness / weakness 10. OTHER SYMPTOMS: "Do you have any other symptoms?" (e.g., fever, abdominal pain, burning with urination, blood in urine)      Denied 11. PREGNANCY: "Is  there any chance you are pregnant?" (e.g., yes, no; LMP)      N/a  Protocols used: BACK PAIN-A-AH

## 2018-03-18 NOTE — Telephone Encounter (Signed)
See Triage note.  Appt. Scheduled today for evaluation of sx's.

## 2018-03-18 NOTE — Telephone Encounter (Signed)
Pt hs been seen.

## 2018-03-18 NOTE — Progress Notes (Signed)
Sydney Padilla is a 57 year old married female non-smoker who comes in today for evaluation of low back pain  This past Sunday she went waterskiing and noticed some mild soreness in her back when she got done skiing but no acute pain.  The next day she began having severe pain.  She describes the pain is constant dull 6 on a scale of 1-10.  It does not radiate to her lower extremity.  She points to the right and left lower lumbar area as a source of her pain.  She denies any other neurologic symptoms.  She had back pain like this in the past.  She is always gotten better with conservative measures medication and PT  Review of systems negative except she had a recent appendectomy for acute appendicitis this past summer  BP 120/78 (BP Location: Right Arm, Patient Position: Sitting, Cuff Size: Large)   Pulse 69   Temp 98.5 F (36.9 C) (Oral)   LMP 05/24/2012 (Approximate)   SpO2 99%  Well-developed well-nourished female no acute distress vital signs stable she is afebrile in the standing position the spine was palpated it was normal and no tender.  In the supine position the legs are of equal length.  Sensation muscle strength reflexes all within normal limits.  Straight leg raising normal  1.  Mechanical low back pain....... treat symptomatically with Motrin 600 mg twice daily........ Flexeril nightly...Marland KitchenMarland KitchenMarland Kitchen physical therapy consult

## 2018-03-18 NOTE — Patient Instructions (Signed)
Motrin 600 mg twice daily with food  Flexeril 5 mg.........Marland Kitchen 1 at bedtime  We will set you up a consult and physical therapy for evaluation and treatment to help relieve your pain and to put you on an exercise program for the future

## 2018-04-21 ENCOUNTER — Encounter: Payer: Self-pay | Admitting: Internal Medicine

## 2018-05-01 ENCOUNTER — Other Ambulatory Visit: Payer: Self-pay | Admitting: Certified Nurse Midwife

## 2018-05-01 DIAGNOSIS — Z1231 Encounter for screening mammogram for malignant neoplasm of breast: Secondary | ICD-10-CM

## 2018-05-27 ENCOUNTER — Encounter: Payer: Self-pay | Admitting: *Deleted

## 2018-06-04 ENCOUNTER — Encounter: Payer: Self-pay | Admitting: Internal Medicine

## 2018-06-04 ENCOUNTER — Ambulatory Visit: Payer: 59 | Admitting: Internal Medicine

## 2018-06-04 VITALS — BP 118/70 | HR 78 | Wt 167.0 lb

## 2018-06-04 DIAGNOSIS — Z1211 Encounter for screening for malignant neoplasm of colon: Secondary | ICD-10-CM

## 2018-06-04 MED ORDER — SUPREP BOWEL PREP KIT 17.5-3.13-1.6 GM/177ML PO SOLN: 1.0000 | ORAL | 0 refills | Status: DC

## 2018-06-04 NOTE — Patient Instructions (Signed)
You have been scheduled for a colonoscopy. Please follow written instructions given to you at your visit today.  Please pick up your prep supplies at the pharmacy within the next 1-3 days. If you use inhalers (even only as needed), please bring them with you on the day of your procedure. Your physician has requested that you go to www.startemmi.com and enter the access code given to you at your visit today. This web site gives a general overview about your procedure. However, you should still follow specific instructions given to you by our office regarding your preparation for the procedure.  If you are age 16 or older, your body mass index should be between 23-30. Your Body mass index is 28.89 kg/m. If this is out of the aforementioned range listed, please consider follow up with your Primary Care Provider.  If you are age 56 or younger, your body mass index should be between 19-25. Your Body mass index is 28.89 kg/m. If this is out of the aformentioned range listed, please consider follow up with your Primary Care Provider.

## 2018-06-04 NOTE — Progress Notes (Signed)
Patient ID: Sydney Padilla, female   DOB: 1961/03/13, 57 y.o.   MRN: [id number] HPI: Sydney Padilla is a 57 year old female with little past medical history who is seen to discuss screening colonoscopy.  She is referred and seen in consultation at the request of Dr. Sherren Mocha.  She has never had a screening colonoscopy.  She denies family history of colon cancer.  She reports normal bowel habits without diarrhea or constipation.  No abdominal pain.  She will very rarely have hemorrhoidal swelling.  Some very occasional heartburn.  No dysphagia or odynophagia.  No nausea or vomiting.  She works for the [employer].  No tobacco use.  Occasional alcohol use.  Past Medical History:  Diagnosis Date  . Migraine without aura since college   more regualr with menses, now in menopause and less frequent  . Tachycardia 2000   occasional use of Atenol    Past Surgical History:  Procedure Laterality Date  . LAPAROSCOPIC APPENDECTOMY N/A 01/27/2017   Procedure: APPENDECTOMY LAPAROSCOPIC;  Surgeon: Coralie Keens, MD;  Location: [facility];  Service: General;  Laterality: N/A;  . WISDOM TOOTH EXTRACTION  age 5    Outpatient Medications Prior to Visit  Medication Sig Dispense Refill  . cetirizine (ZYRTEC) 10 MG tablet Take 10 mg by mouth at bedtime.     . cyclobenzaprine (FLEXERIL) 5 MG tablet 1 p.o. nightly as needed 30 tablet 2  . estradiol (ESTRACE) 0.5 MG tablet Take 1 tablet (0.5 mg total) by mouth daily. 30 tablet 12  . progesterone (PROMETRIUM) 100 MG capsule Take 1 capsule (100 mg total) by mouth daily. 30 capsule 12  . zolmitriptan (ZOMIG-ZMT) 2.5 MG disintegrating tablet TAKE 1 TABLET(S) BY MOUTH AS NEEDED FOR HEADACHE 6 tablet 6  . atenolol (TENORMIN) 25 MG tablet Take 1 tablet (25 mg total) by mouth as needed (heart rate). (Patient not taking: Reported on 06/04/2018) 25 tablet 3   No facility-administered medications prior to visit.     No Known Allergies  Family History  Problem Relation Age of  Onset  . Hypertension Mother   . Osteoarthritis Mother   . Alzheimer's disease Mother   . Heart disease Father        Psychologist, forensic  . Atrial fibrillation Father   . Diabetes Maternal Grandmother   . Hypertension Maternal Grandmother   . Stroke Maternal Grandmother   . Uterine cancer Maternal Grandmother   . Heart disease Maternal Grandfather   . Cancer Other        uterine  . Breast cancer Neg Hx     Social History   Tobacco Use  . Smoking status: Never Smoker  . Smokeless tobacco: Never Used  Substance Use Topics  . Alcohol use: Yes    Alcohol/week: 1.0 standard drinks    Types: 1 Standard drinks or equivalent per week  . Drug use: No    ROS: As per history of present illness, otherwise negative  BP 118/70   Pulse 78   Wt 167 lb (75.8 kg)   LMP 05/24/2012 (Approximate)   SpO2 97%   BMI 28.89 kg/m  Constitutional: Well-developed and well-nourished. No distress. HEENT: Normocephalic and atraumatic. Conjunctivae are normal.  No scleral icterus. Neck: Neck supple. Trachea midline. Cardiovascular: Normal rate, regular rhythm and intact distal pulses. No M/R/G Pulmonary/chest: Effort normal and breath sounds normal. No wheezing, rales or rhonchi. Abdominal: Soft, nontender, nondistended. Bowel sounds active throughout. There are no masses palpable. No hepatosplenomegaly. Extremities: no clubbing, cyanosis, or edema Neurological:  Alert and oriented to person place and time. Skin: Skin is warm and dry.  Psychiatric: Normal mood and affect. Behavior is normal.  RELEVANT LABS AND IMAGING: CBC    Component Value Date/Time   WBC 10.8 (H) 01/27/2017 1222   RBC 4.43 01/27/2017 1222   HGB 13.2 01/27/2017 1222   HCT 39.1 01/27/2017 1222   PLT 187 01/27/2017 1222   MCV 88.3 01/27/2017 1222   MCH 29.8 01/27/2017 1222   MCHC 33.8 01/27/2017 1222   RDW 12.7 01/27/2017 1222   LYMPHSABS 1.9 07/11/2015 1008   MONOABS 0.3 07/11/2015 1008   EOSABS 0.1 07/11/2015 1008    BASOSABS 0.0 07/11/2015 1008    CMP     Component Value Date/Time   NA 137 01/27/2017 1222   K 3.9 01/27/2017 1222   CL 104 01/27/2017 1222   CO2 23 01/27/2017 1222   GLUCOSE 110 (H) 01/27/2017 1222   BUN 11 01/27/2017 1222   CREATININE 0.66 01/27/2017 1222   CALCIUM 10.1 01/27/2017 1222   PROT 6.9 01/27/2017 1222   ALBUMIN 4.1 01/27/2017 1222   AST 21 01/27/2017 1222   ALT 21 01/27/2017 1222   ALKPHOS 63 01/27/2017 1222   BILITOT 0.9 01/27/2017 1222   GFRNONAA >60 01/27/2017 1222   GFRAA >60 01/27/2017 1222    ASSESSMENT/PLAN: 57 year old female with little past medical history who is seen to discuss screening colonoscopy.   1. Colon cancer screening --she is average risk.  She is due screening based on her age.  We discussed the risks, benefits and alternatives to colonoscopy.  I have recommended this test for her.  She is agreeable to proceed.   LX:BWIO, Jory Ee, Md No address on file

## 2018-06-10 ENCOUNTER — Ambulatory Visit
Admission: RE | Admit: 2018-06-10 | Discharge: 2018-06-10 | Disposition: A | Discharge status: Home / Self Care | Payer: 59 | Source: Ambulatory Visit | Attending: Certified Nurse Midwife | Admitting: Certified Nurse Midwife

## 2018-06-10 DIAGNOSIS — Z1231 Encounter for screening mammogram for malignant neoplasm of breast: Secondary | ICD-10-CM

## 2018-06-24 HISTORY — PX: POLYPECTOMY: SHX149

## 2018-06-24 HISTORY — PX: COLONOSCOPY: SHX174

## 2018-06-25 ENCOUNTER — Encounter: Payer: Self-pay | Admitting: Internal Medicine

## 2018-07-08 ENCOUNTER — Ambulatory Visit (AMBULATORY_SURGERY_CENTER): Payer: 59 | Admitting: Internal Medicine

## 2018-07-08 ENCOUNTER — Encounter: Payer: Self-pay | Admitting: Internal Medicine

## 2018-07-08 VITALS — BP 103/66 | HR 66 | Temp 98.0°F | Resp 15 | Ht 64.8 in | Wt 167.0 lb

## 2018-07-08 DIAGNOSIS — Z1211 Encounter for screening for malignant neoplasm of colon: Secondary | ICD-10-CM | POA: Diagnosis present

## 2018-07-08 DIAGNOSIS — D129 Benign neoplasm of anus and anal canal: Secondary | ICD-10-CM

## 2018-07-08 DIAGNOSIS — D128 Benign neoplasm of rectum: Secondary | ICD-10-CM

## 2018-07-08 DIAGNOSIS — D123 Benign neoplasm of transverse colon: Secondary | ICD-10-CM | POA: Diagnosis not present

## 2018-07-08 DIAGNOSIS — K621 Rectal polyp: Secondary | ICD-10-CM | POA: Diagnosis not present

## 2018-07-08 DIAGNOSIS — K635 Polyp of colon: Secondary | ICD-10-CM | POA: Diagnosis not present

## 2018-07-08 DIAGNOSIS — D125 Benign neoplasm of sigmoid colon: Secondary | ICD-10-CM

## 2018-07-08 MED ORDER — SODIUM CHLORIDE 0.9 % IV SOLN
500.0000 mL | Freq: Once | INTRAVENOUS | Status: DC
Start: 2018-07-08 — End: 2018-07-08

## 2018-07-08 NOTE — Progress Notes (Signed)
Called to room to assist during endoscopic procedure.  Patient ID and intended procedure confirmed with present staff. Received instructions for my participation in the procedure from the performing physician.  

## 2018-07-08 NOTE — Op Note (Signed)
[facility] Patient Name: Sydney Padilla Procedure Date: 07/08/2018 2:08 PM MRN: [id number] Endoscopist: Jerene Bears , MD Age: 58 Referring MD:  Date of Birth: [birth year] Gender: Female Account #: [id number] Procedure:                Colonoscopy Indications:              Screening for colorectal malignant neoplasm, This                            is the patient's first colonoscopy Medicines:                Monitored Anesthesia Care Procedure:                Pre-Anesthesia Assessment:                           - Prior to the procedure, a History and Physical                            was performed, and patient medications and                            allergies were reviewed. The patient's tolerance of                            previous anesthesia was also reviewed. The risks                            and benefits of the procedure and the sedation                            options and risks were discussed with the patient.                            All questions were answered, and informed consent                            was obtained. Prior Anticoagulants: The patient has                            taken no previous anticoagulant or antiplatelet                            agents. ASA Grade Assessment: II - A patient with                            mild systemic disease. After reviewing the risks                            and benefits, the patient was deemed in                            satisfactory condition to undergo the procedure.  After obtaining informed consent, the colonoscope                            was passed under direct vision. Throughout the                            procedure, the patient's blood pressure, pulse, and                            oxygen saturations were monitored continuously. The                            Model PCF-H190DL [id number]) scope was introduced                            through the anus and  advanced to the the cecum,                            identified by appendiceal orifice and ileocecal                            valve. The colonoscopy was performed without                            difficulty. The patient tolerated the procedure                            well. The quality of the bowel preparation was                            good. The ileocecal valve, appendiceal orifice, and                            rectum were photographed. Scope In: 2:19:22 PM Scope Out: 2:44:03 PM Scope Withdrawal Time: 0 hours 20 minutes 29 seconds  Total Procedure Duration: 0 hours 24 minutes 41 seconds  Findings:                 Hemorrhoids were found on perianal exam.                           Four sessile polyps were found in the transverse                            colon. The polyps were 4 to 7 mm in size. These                            polyps were removed with a cold snare. Resection                            and retrieval were complete.                           Three sessile polyps were found in the sigmoid  colon. The polyps were 3 to 5 mm in size. These                            polyps were removed with a cold snare. Resection                            and retrieval were complete.                           A 5 mm polyp was found in the rectum. The polyp was                            sessile. The polyp was removed with a cold snare.                            Resection and retrieval were complete.                           No additional abnormalities were found on                            retroflexion. Complications:            No immediate complications. Estimated Blood Loss:     Estimated blood loss was minimal. Impression:               - Hemorrhoids found on perianal exam.                           - Four 4 to 7 mm polyps in the transverse colon,                            removed with a cold snare. Resected and retrieved.                            - Three 3 to 5 mm polyps in the sigmoid colon,                            removed with a cold snare. Resected and retrieved.                           - One 5 mm polyp in the rectum, removed with a cold                            snare. Resected and retrieved. Recommendation:           - Patient has a contact number available for                            emergencies. The signs and symptoms of potential                            delayed complications were discussed with the  patient. Return to normal activities tomorrow.                            Written discharge instructions were provided to the                            patient.                           - Resume previous diet.                           - Continue present medications.                           - Await pathology results.                           - Repeat colonoscopy is recommended for                            surveillance. The colonoscopy date will be                            determined after pathology results from today's                            exam become available for review. Jerene Bears, MD 07/08/2018 2:48:13 PM This report has been signed electronically.

## 2018-07-08 NOTE — Progress Notes (Signed)
Report to PACU, RN, vss, BBS= Clear.  

## 2018-07-08 NOTE — Patient Instructions (Signed)
Handouts given on polyps and hemorrhoids   YOU HAD AN ENDOSCOPIC PROCEDURE TODAY AT THE [facility]:   Refer to the procedure report that was given to you for any specific questions about what was found during the examination.  If the procedure report does not answer your questions, please call your gastroenterologist to clarify.  If you requested that your care partner not be given the details of your procedure findings, then the procedure report has been included in a sealed envelope for you to review at your convenience later.  YOU SHOULD EXPECT: Some feelings of bloating in the abdomen. Passage of more gas than usual.  Walking can help get rid of the air that was put into your GI tract during the procedure and reduce the bloating. If you had a lower endoscopy (such as a colonoscopy or flexible sigmoidoscopy) you may notice spotting of blood in your stool or on the toilet paper. If you underwent a bowel prep for your procedure, you may not have a normal bowel movement for a few days.  Please Note:  You might notice some irritation and congestion in your nose or some drainage.  This is from the oxygen used during your procedure.  There is no need for concern and it should clear up in a day or so.  SYMPTOMS TO REPORT IMMEDIATELY:   Following lower endoscopy (colonoscopy or flexible sigmoidoscopy):  Excessive amounts of blood in the stool  Significant tenderness or worsening of abdominal pains  Swelling of the abdomen that is new, acute  Fever of 100F or higher    For urgent or emergent issues, a gastroenterologist can be reached at any hour by calling [phone].   DIET:  We do recommend a small meal at first, but then you may proceed to your regular diet.  Drink plenty of fluids but you should avoid alcoholic beverages for 24 hours.  ACTIVITY:  You should plan to take it easy for the rest of today and you should NOT DRIVE or use heavy machinery until tomorrow (because of  the sedation medicines used during the test).    FOLLOW UP: Our staff will call the number listed on your records the next business day following your procedure to check on you and address any questions or concerns that you may have regarding the information given to you following your procedure. If we do not reach you, we will leave a message.  However, if you are feeling well and you are not experiencing any problems, there is no need to return our call.  We will assume that you have returned to your regular daily activities without incident.  If any biopsies were taken you will be contacted by phone or by letter within the next 1-3 weeks.  Please call us at [phone] if you have not heard about the biopsies in 3 weeks.    SIGNATURES/CONFIDENTIALITY: You and/or your care partner have signed paperwork which will be entered into your electronic medical record.  These signatures attest to the fact that that the information above on your After Visit Summary has been reviewed and is understood.  Full responsibility of the confidentiality of this discharge information lies with you and/or your care-partner. 

## 2018-07-09 ENCOUNTER — Telehealth: Payer: Self-pay

## 2018-07-09 NOTE — Telephone Encounter (Signed)
  Follow up Call-  Call back number 07/08/2018  Post procedure Call Back phone  # [phone]  Permission to leave phone message Yes  Some recent data might be hidden     Patient questions:  Do you have a fever, pain , or abdominal swelling? No. Pain Score  0 *  Have you tolerated food without any problems? Yes.    Have you been able to return to your normal activities? Yes.    Do you have any questions about your discharge instructions: Diet   No. Medications  No. Follow up visit  No.  Do you have questions or concerns about your Care? No.  Actions: * If pain score is 4 or above: No action needed, pain <4.

## 2018-07-14 ENCOUNTER — Encounter: Payer: Self-pay | Admitting: Internal Medicine

## 2018-10-29 ENCOUNTER — Ambulatory Visit: Payer: 59 | Admitting: Certified Nurse Midwife

## 2018-11-18 ENCOUNTER — Other Ambulatory Visit: Payer: Self-pay | Admitting: Certified Nurse Midwife

## 2018-11-18 DIAGNOSIS — G43009 Migraine without aura, not intractable, without status migrainosus: Secondary | ICD-10-CM

## 2018-11-18 DIAGNOSIS — Z79890 Hormone replacement therapy (postmenopausal): Secondary | ICD-10-CM

## 2018-11-18 DIAGNOSIS — N951 Menopausal and female climacteric states: Secondary | ICD-10-CM

## 2018-11-19 ENCOUNTER — Other Ambulatory Visit: Payer: Self-pay

## 2018-11-19 DIAGNOSIS — N951 Menopausal and female climacteric states: Secondary | ICD-10-CM

## 2018-11-19 MED ORDER — ESTRADIOL 0.5 MG PO TABS: 0.5000 mg | ORAL_TABLET | Freq: Every day | ORAL | 0 refills | Status: DC

## 2018-11-19 NOTE — Telephone Encounter (Signed)
Medication refill request: estradiol 0.5mg  Last AEX:  10-23-17 Next AEX: 12-02-2018 Last MMG (if hormonal medication request): 06-10-18 category d density birads 1:neg Refill authorized: please approve if appropriate

## 2018-11-19 NOTE — Telephone Encounter (Signed)
Medication refill request: Progesterone and Zolmitriptan  Last AEX:  10/23/17 DL Next AEX: 12/02/18 Last MMG (if hormonal medication request): 06/10/18 BIRADS 1 negative/density d Refill authorized: Please advise on refills;

## 2018-12-02 ENCOUNTER — Encounter: Payer: Self-pay | Admitting: Certified Nurse Midwife

## 2018-12-02 ENCOUNTER — Ambulatory Visit (INDEPENDENT_AMBULATORY_CARE_PROVIDER_SITE_OTHER): Payer: 59 | Admitting: Certified Nurse Midwife

## 2018-12-02 ENCOUNTER — Other Ambulatory Visit: Payer: Self-pay

## 2018-12-02 ENCOUNTER — Other Ambulatory Visit (HOSPITAL_COMMUNITY)
Admission: RE | Admit: 2018-12-02 | Discharge: 2018-12-02 | Disposition: A | Discharge status: Home / Self Care | Payer: 59 | Source: Ambulatory Visit | Attending: Certified Nurse Midwife | Admitting: Certified Nurse Midwife

## 2018-12-02 VITALS — BP 120/78 | HR 70 | Temp 97.6°F | Resp 16 | Ht 63.75 in | Wt 160.0 lb

## 2018-12-02 DIAGNOSIS — Z0000 Encounter for general adult medical examination without abnormal findings: Secondary | ICD-10-CM

## 2018-12-02 DIAGNOSIS — Z79890 Hormone replacement therapy (postmenopausal): Secondary | ICD-10-CM

## 2018-12-02 DIAGNOSIS — Z01419 Encounter for gynecological examination (general) (routine) without abnormal findings: Secondary | ICD-10-CM | POA: Diagnosis not present

## 2018-12-02 DIAGNOSIS — N951 Menopausal and female climacteric states: Secondary | ICD-10-CM | POA: Diagnosis not present

## 2018-12-02 DIAGNOSIS — Z124 Encounter for screening for malignant neoplasm of cervix: Secondary | ICD-10-CM

## 2018-12-02 DIAGNOSIS — G43009 Migraine without aura, not intractable, without status migrainosus: Secondary | ICD-10-CM

## 2018-12-02 MED ORDER — PROGESTERONE MICRONIZED 100 MG PO CAPS: 100.0000 mg | ORAL_CAPSULE | Freq: Every day | ORAL | 12 refills | Status: DC

## 2018-12-02 MED ORDER — ZOLMITRIPTAN 2.5 MG PO TBDP: ORAL_TABLET | ORAL | 0 refills | Status: DC

## 2018-12-02 MED ORDER — ESTRADIOL 0.5 MG PO TABS: 0.5000 mg | ORAL_TABLET | Freq: Every day | ORAL | 12 refills | Status: DC

## 2018-12-02 NOTE — Progress Notes (Signed)
58 y.o. G0P0000 Married  Caucasian Fe here for annual exam. Menopausal on HRT working well, denies warning signs with use.. Denies vaginal bleeding. PCP retired will be looking to establish again.  Desires screening labs. Had colonoscopy with polyps noted. No issues with exam. Sleeping better now, working at home. Zomig working for migraines. Still trying keep weight down and exercising. No health issues today.  Patient's last menstrual period was 05/24/2012 (approximate).          Sexually active: Yes.    The current method of family planning is post menopausal status.    Exercising: Yes.    yoga 2-3 times a week, some walking Smoker:  no  Review of Systems  Constitutional: Negative.   HENT: Negative.   Eyes: Negative.   Respiratory: Negative.   Cardiovascular: Negative.   Gastrointestinal: Negative.   Genitourinary: Negative.   Musculoskeletal: Negative.   Skin: Negative.   Neurological: Negative.   Endo/Heme/Allergies: Negative.   Psychiatric/Behavioral: Negative.     Health Maintenance: Pap:  09-01-15 neg HPV HR neg History of Abnormal Pap: no MMG:  06-10-18 category d density birads 1:neg Self Breast exams: no Colonoscopy:  2020 8 polyps f/u 20yrs BMD:   2011 TDaP:  2018 Shingles: no Pneumonia: no Hep C and HIV: hep c neg 2018 Labs: yes   reports that she has never smoked. She has never used smokeless tobacco. She reports current alcohol use of about 1.0 standard drinks of alcohol per week. She reports that she does not use drugs.  Past Medical History:  Diagnosis Date  . Migraine without aura since college   more regualr with menses, now in menopause and less frequent  . Tachycardia 2000   occasional use of Atenol    Past Surgical History:  Procedure Laterality Date  . LAPAROSCOPIC APPENDECTOMY N/A 01/27/2017   Procedure: APPENDECTOMY LAPAROSCOPIC;  Surgeon: Coralie Keens, MD;  Location: [facility];  Service: General;  Laterality: N/A;  . WISDOM TOOTH EXTRACTION   age 61    Current Outpatient Medications  Medication Sig Dispense Refill  . estradiol (ESTRACE) 0.5 MG tablet Take 1 tablet (0.5 mg total) by mouth daily. 30 tablet 0  . progesterone (PROMETRIUM) 100 MG capsule TAKE ONE CAPSULE BY MOUTH DAILY 30 capsule 0  . zolmitriptan (ZOMIG-ZMT) 2.5 MG disintegrating tablet TAKE ONE TABLET BY MOUTH ONCE A DAY AS NEEDED FOR HEADACHE 6 tablet 0   No current facility-administered medications for this visit.     Family History  Problem Relation Age of Onset  . Hypertension Mother   . Osteoarthritis Mother   . Alzheimer's disease Mother   . Heart disease Father        Psychologist, forensic  . Atrial fibrillation Father   . Diabetes Maternal Grandmother   . Hypertension Maternal Grandmother   . Stroke Maternal Grandmother   . Uterine cancer Maternal Grandmother   . Heart disease Maternal Grandfather   . Cancer Other        uterine  . Breast cancer Neg Hx     ROS:  Pertinent items are noted in HPI.  Otherwise, a comprehensive ROS was negative.  Exam:   BP 120/78   Pulse 70   Temp 97.6 F (36.4 C) (Skin)   Resp 16   Ht 5' 3.75" (1.619 m)   Wt 160 lb (72.6 kg)   LMP 05/24/2012 (Approximate)   BMI 27.68 kg/m  Height: 5' 3.75" (161.9 cm) Ht Readings from Last 3 Encounters:  12/02/18 5' 3.75" (  1.619 m)  07/08/18 5' 4.8" (1.646 m)  10/23/17 5' 3.75" (1.619 m)    General appearance: alert, cooperative and appears stated age Head: Normocephalic, without obvious abnormality, atraumatic Neck: no adenopathy, supple, symmetrical, trachea midline and thyroid normal to inspection and palpation Lungs: clear to auscultation bilaterally Breasts: normal appearance, no masses or tenderness, No nipple retraction or dimpling, No nipple discharge or bleeding, No axillary or supraclavicular adenopathy Heart: regular rate and rhythm Abdomen: soft, non-tender; no masses,  no organomegaly Extremities: extremities normal, atraumatic, no cyanosis or edema Skin: Skin  color, texture, turgor normal. No rashes or lesions Lymph nodes: Cervical, supraclavicular, and axillary nodes normal. No abnormal inguinal nodes palpated Neurologic: Grossly normal   Pelvic: External genitalia:  no lesions              Urethra:  normal appearing urethra with no masses, tenderness or lesions              Bartholin's and Skene's: normal                 Vagina: normal appearing vagina with normal color and discharge, no lesions              Cervix: multiparous appearance, no cervical motion tenderness and no lesions              Pap taken: Yes.   Bimanual Exam:  Uterus:  normal size, contour, position, consistency, mobility, non-tender and anteverted              Adnexa: normal adnexa and no mass, fullness, tenderness               Rectovaginal: Confirms               Anus:  normal sphincter tone, no lesions  Chaperone present: yes  A:  Well Woman with normal exam  Menopausal on HRT desires continuance  History of Migraine headache no aura, Zomig works well, needs Rx  Screening labs  P:   Reviewed health and wellness pertinent to exam  Aware of need to advise if vaginal bleeding  Risks/benefits/warning signs of HRT reviewed and need to advise if occurs.  Rx Prometrium see order  Rx Estrace see order  Rx Zomig see order with instructions and warnings  Labs: CMP, Lipid panel, Vitamin D, TSH  Pap smear: yes   counseled on breast self exam, mammography screening, feminine hygiene, menopause, adequate intake of calcium and vitamin D, diet and exercise  return annually or prn  An After Visit Summary was printed and given to the patient.

## 2018-12-03 LAB — LIPID PANEL
Chol/HDL Ratio: 2.2 ratio (ref 0.0–4.4)
Cholesterol, Total: 199 mg/dL (ref 100–199)
HDL: 91 mg/dL (ref >39)
LDL Calculated: 91 mg/dL (ref 0–99)
Triglycerides: 84 mg/dL (ref 0–149)
VLDL Cholesterol Cal: 17 mg/dL (ref 5–40)

## 2018-12-03 LAB — COMPREHENSIVE METABOLIC PANEL
ALT: 18 IU/L (ref 0–32)
AST: 16 IU/L (ref 0–40)
Albumin/Globulin Ratio: 1.8 (ref 1.2–2.2)
Albumin: 4.5 g/dL (ref 3.8–4.9)
Alkaline Phosphatase: 80 IU/L (ref 39–117)
BUN/Creatinine Ratio: 20 (ref 9–23)
BUN: 17 mg/dL (ref 6–24)
Bilirubin Total: 0.2 mg/dL (ref 0.0–1.2)
CO2: 26 mmol/L (ref 20–29)
Calcium: 9.6 mg/dL (ref 8.7–10.2)
Chloride: 102 mmol/L (ref 96–106)
Creatinine, Ser: 0.86 mg/dL (ref 0.57–1.00)
GFR calc Af Amer: 87 mL/min/{1.73_m2} (ref >59)
GFR calc non Af Amer: 75 mL/min/{1.73_m2} (ref >59)
Globulin, Total: 2.5 g/dL (ref 1.5–4.5)
Glucose: 91 mg/dL (ref 65–99)
Potassium: 4.9 mmol/L (ref 3.5–5.2)
Sodium: 141 mmol/L (ref 134–144)
Total Protein: 7 g/dL (ref 6.0–8.5)

## 2018-12-03 LAB — CBC
Hematocrit: 39.8 % (ref 34.0–46.6)
Hemoglobin: 13 g/dL (ref 11.1–15.9)
MCH: 30.3 pg (ref 26.6–33.0)
MCHC: 32.7 g/dL (ref 31.5–35.7)
MCV: 93 fL (ref 79–97)
Platelets: 247 10*3/uL (ref 150–450)
RBC: 4.29 x10E6/uL (ref 3.77–5.28)
RDW: 12.4 % (ref 11.7–15.4)
WBC: 5.4 10*3/uL (ref 3.4–10.8)

## 2018-12-03 LAB — VITAMIN D 25 HYDROXY (VIT D DEFICIENCY, FRACTURES): Vit D, 25-Hydroxy: 24 ng/mL — ABNORMAL LOW (ref 30.0–100.0)

## 2018-12-03 LAB — TSH: TSH: 0.846 u[IU]/mL (ref 0.450–4.500)

## 2018-12-04 ENCOUNTER — Other Ambulatory Visit: Payer: Self-pay

## 2018-12-04 DIAGNOSIS — E559 Vitamin D deficiency, unspecified: Secondary | ICD-10-CM

## 2018-12-04 LAB — CYTOLOGY - PAP
Diagnosis: NEGATIVE
HPV: NOT DETECTED

## 2018-12-04 NOTE — Progress Notes (Signed)
vit

## 2018-12-24 ENCOUNTER — Telehealth: Payer: Self-pay | Admitting: *Deleted

## 2018-12-24 ENCOUNTER — Telehealth: Payer: Self-pay | Admitting: Certified Nurse Midwife

## 2018-12-24 ENCOUNTER — Ambulatory Visit: Payer: 59 | Admitting: Family Medicine

## 2018-12-24 NOTE — Telephone Encounter (Signed)
Spoke with Dr.Silva.She recommend patient go to an Urgent Care for evaluation since she has had a temperature. We have to be COVID mindful and we are not sure she definitely has UTI or kidney infection. Patient voiced understanding and thanked me for calling her.  Routed to Dr.Silva to sign and close.

## 2018-12-24 NOTE — Telephone Encounter (Signed)
Sydney Ruddy, MD  Agnes Lawrence, CMA        Good Morning!   The pt you added at 1030 may have to go to UC as she needs a UA, but because she has a fever she can't come in to clinic to have it done.   Thanks,  Lockie Pares       Per Dr Volanda Napoleon, I called the pt and informed her of the previous message and cancelled the appt.  Patient stated she does not need the urine checked as generally blood work is needed in her case as she has had kidney infections before.  I advised the pt that would be up to the provider at the urgent care and she stated "thank you but no thank you".

## 2018-12-24 NOTE — Telephone Encounter (Signed)
Patient will be best served in ER/urgent care setting for complete evaluation and treatment.  May need blood work, IV fluids, or parenteral treatment.  Encounter closed.

## 2018-12-24 NOTE — Telephone Encounter (Signed)
Patient believes she has a UTI. Patient stated that she is having lower back pain and "spiked a fever last night." Patient is requesting an antibiotic to be called in.

## 2018-12-24 NOTE — Telephone Encounter (Signed)
Spoke with patient. She complains of lower back pain last PM, chills and temp of 100.0. she feels she has possible "kidney infection".denies any urinary symptoms. Patient states 20 years ago was seen in E[facility] and dxd with what sounds like pyelonephritis. This AM temp is 98.3 and feeling some better but concerned. Requesting antibiotic. COVID screen negative except she has been to [employer] recently. She states stayed in her mother's house and ordered in--did not go out. Please advise. Will discuss with provider and call her back.

## 2019-03-05 ENCOUNTER — Telehealth: Payer: Self-pay | Admitting: Certified Nurse Midwife

## 2019-03-05 NOTE — Telephone Encounter (Signed)
Thank you :)

## 2019-03-05 NOTE — Telephone Encounter (Signed)
Patient canceled her upcoming vitamin d check 03/09/19 via the automated reminder call. I left her a message to call and reschedule.

## 2019-03-09 ENCOUNTER — Other Ambulatory Visit: Payer: 59

## 2019-05-26 ENCOUNTER — Other Ambulatory Visit: Payer: Self-pay | Admitting: Certified Nurse Midwife

## 2019-05-26 DIAGNOSIS — Z1231 Encounter for screening mammogram for malignant neoplasm of breast: Secondary | ICD-10-CM

## 2019-07-15 ENCOUNTER — Ambulatory Visit
Admission: RE | Admit: 2019-07-15 | Discharge: 2019-07-15 | Disposition: A | Discharge status: Home / Self Care | Payer: 59 | Source: Ambulatory Visit | Attending: Certified Nurse Midwife | Admitting: Certified Nurse Midwife

## 2019-07-15 ENCOUNTER — Other Ambulatory Visit: Payer: Self-pay

## 2019-07-15 DIAGNOSIS — Z1231 Encounter for screening mammogram for malignant neoplasm of breast: Secondary | ICD-10-CM

## 2019-07-15 IMAGING — MG DIGITAL SCREENING BILAT W/ TOMO W/ CAD
8 series · 8 of 24 positions shown · non-contrast
Comparison: Previous exam(s).

CLINICAL DATA: Screening.

EXAM:
DIGITAL SCREENING BILATERAL MAMMOGRAM WITH TOMO AND CAD

[R CC synth-2D]
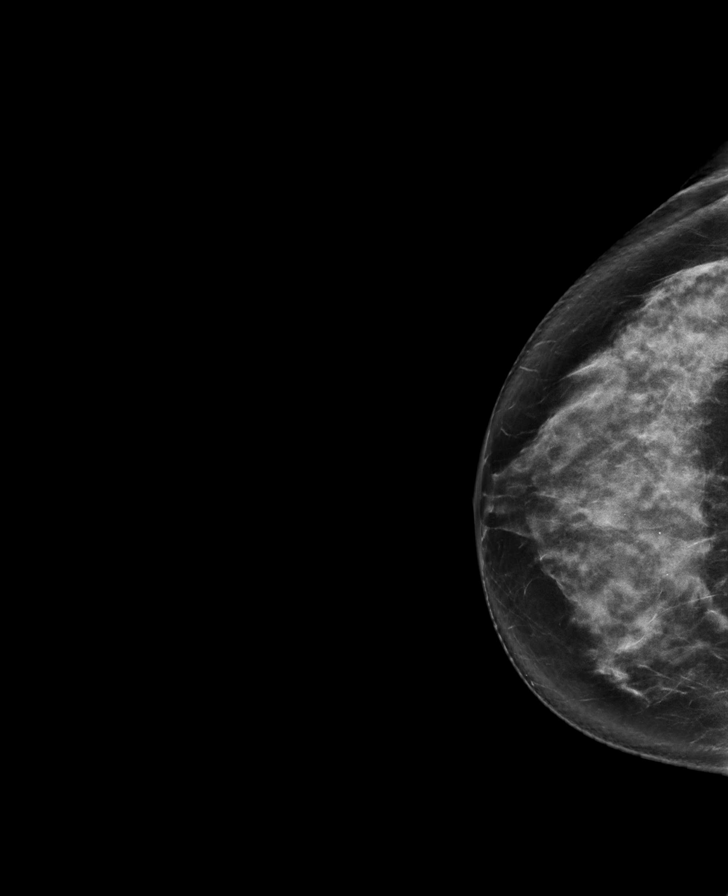

[R MLO synth-2D]
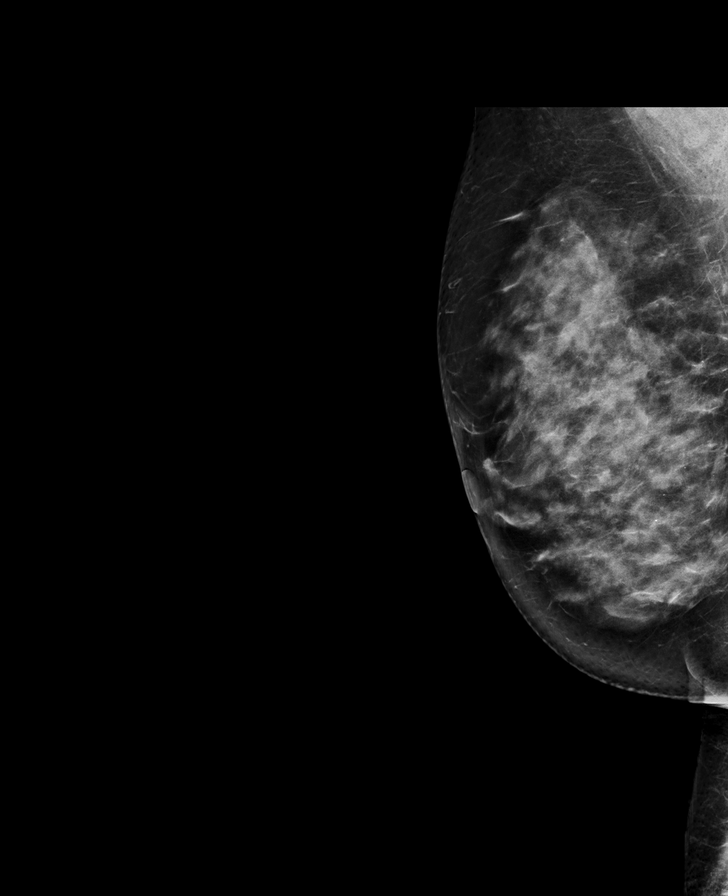

[L MLO synth-2D]
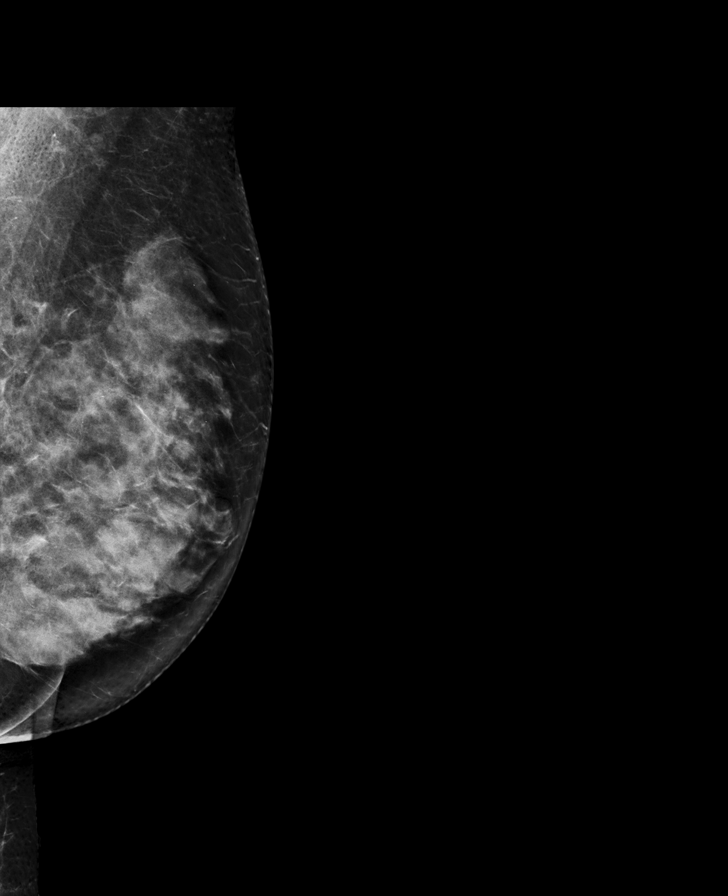

[L CC synth-2D]
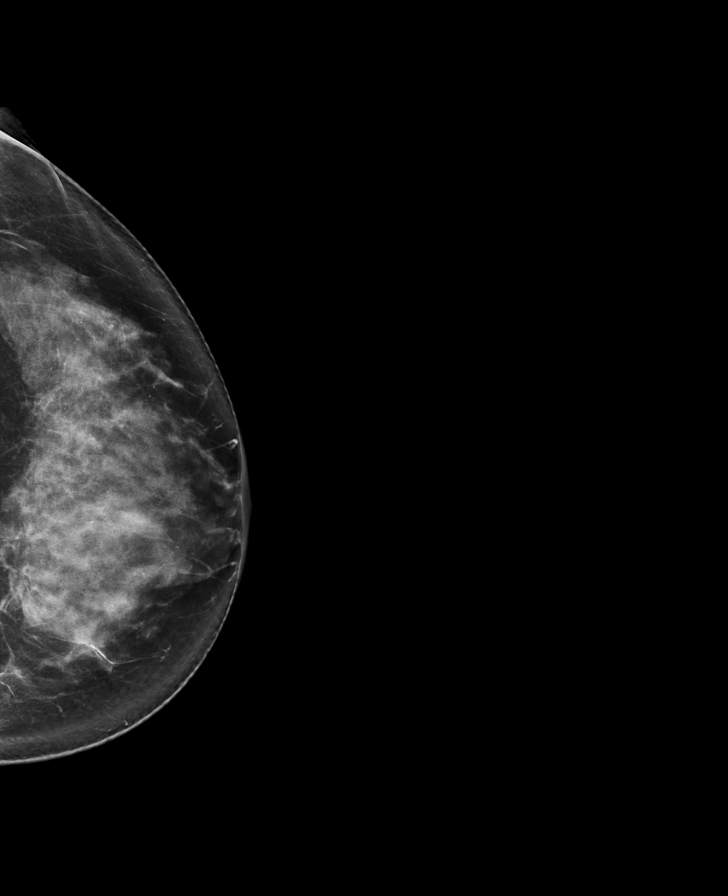

[R MLO tomo · tomo slice 42/83.0]
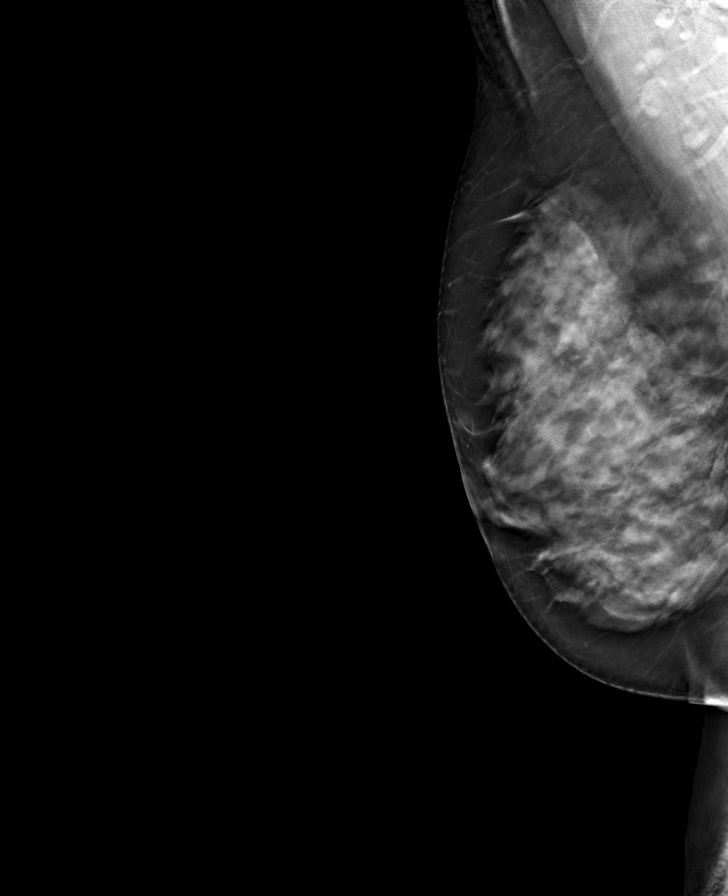

[L CC tomo · tomo slice 45/88.0]
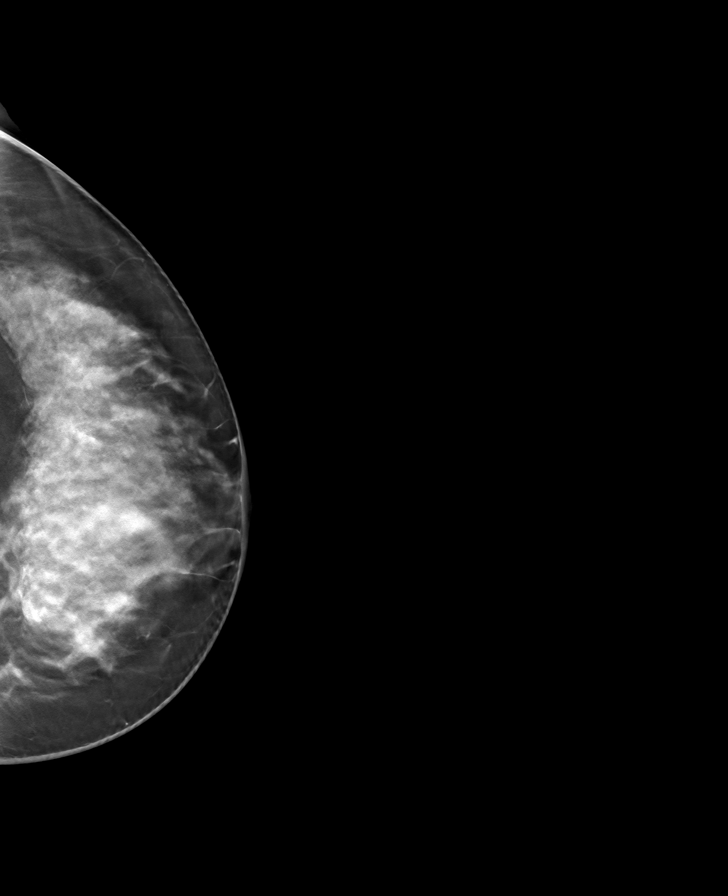

[R CC tomo · tomo slice 45/88.0]
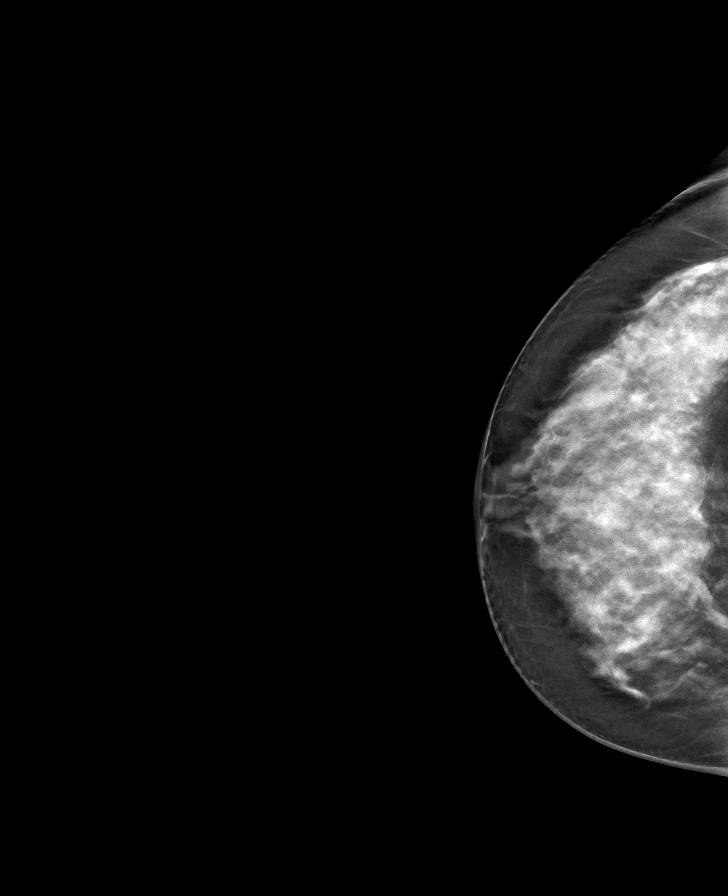

[L MLO tomo · tomo slice 43/84.0]
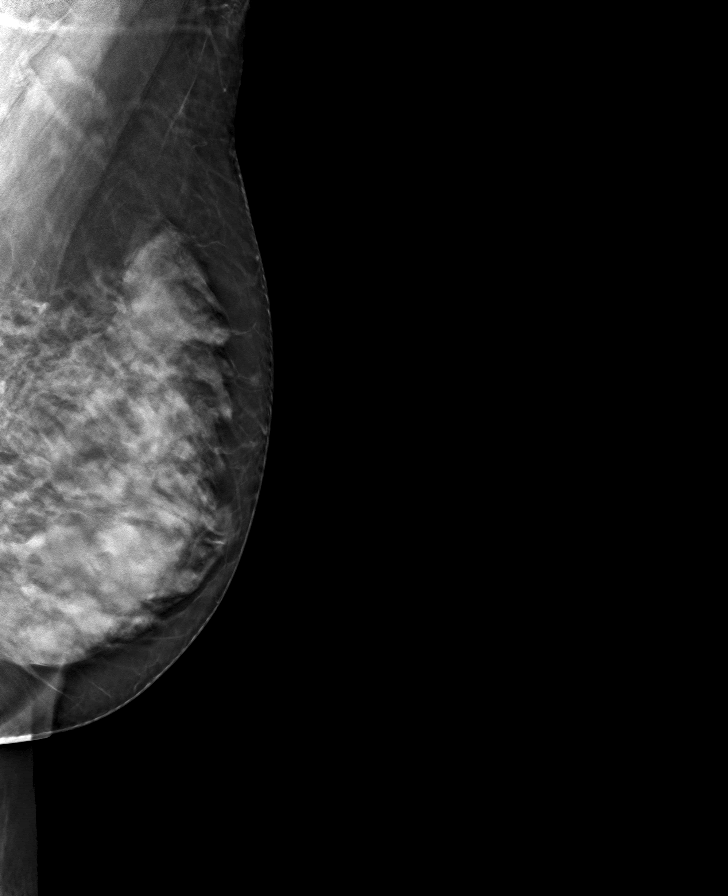

[8 of 24 positions shown; findings below may reference images not displayed]

ACR Breast Density Category d: The breast tissue is extremely dense,
which lowers the sensitivity of mammography
FINDINGS: There are no findings suspicious for malignancy. Images were
processed with CAD.
IMPRESSION: No mammographic evidence of malignancy. A result letter of this
screening mammogram will be mailed directly to the patient.

RECOMMENDATION:
Screening mammogram in one year. (Code:[5I])

BI-RADS CATEGORY  1: Negative.

## 2019-07-18 NOTE — Progress Notes (Signed)
Mammogram reviewed negative. D density, birads  1 negative Will need discussion regarding D density as next aex

## 2019-09-15 ENCOUNTER — Encounter: Payer: Self-pay | Admitting: Certified Nurse Midwife

## 2019-12-07 ENCOUNTER — Other Ambulatory Visit: Payer: Self-pay

## 2019-12-07 NOTE — Progress Notes (Signed)
59 y.o. [place] Married Caucasian female here for annual exam.     Wants to discuss HRT due to her moodiness. She gets very emotional at times. States it is hard to let things go.   Does have good sleep quality.  Does feel warm at night, memory foam mattress.  Not interested in prescription medication.   Taking HRT for 8 - 9 years.   Working for [employer] from home.  Usually travels for work.  Would like to retire next year but having difficulty initiating the process due to her daily responsibilities. Mother is in [place] in independent living.   Doing pilates and walking.   Did receive her Covid vaccine, October 04, 2019.   PCP: PCP retired    Patient's last menstrual period was 05/24/2012 (approximate).           Sexually active: Yes.    The current method of family planning is post menopausal status.    Exercising: Yes.    pilates Smoker:  no  Health Maintenance: Pap: 12-02-18 Neg:Neg HR HPV, 09-01-15 Neg:Neg HR HPV, 07-01-12 Neg:Neg HR HPV History of abnormal Pap:  no MMG: 07-15-19 3D/Neg/density D/Birads1 Colonoscopy:   2020 8 polyps f/u 60yrs BMD: 2011  Result :Normal TDaP:  2018 Gardasil:   no HIV: 2018 NR Hep C: 2018 Neg Screening Labs:  PCP.   reports that she has never smoked. She has never used smokeless tobacco. She reports current alcohol use of about 1.0 standard drink of alcohol per week. She reports that she does not use drugs.  Past Medical History:  Diagnosis Date  . Migraine without aura since college   more regualr with menses, now in menopause and less frequent  . Tachycardia 2000   occasional use of Atenol    Past Surgical History:  Procedure Laterality Date  . LAPAROSCOPIC APPENDECTOMY N/A 01/27/2017   Procedure: APPENDECTOMY LAPAROSCOPIC;  Surgeon: Coralie Keens, MD;  Location: [facility];  Service: General;  Laterality: N/A;  . WISDOM TOOTH EXTRACTION  age 67    Current Outpatient Medications  Medication Sig Dispense Refill  .  estradiol (ESTRACE) 0.5 MG tablet Take 1 tablet (0.5 mg total) by mouth daily. 30 tablet 12  . progesterone (PROMETRIUM) 100 MG capsule Take 1 capsule (100 mg total) by mouth daily. 30 capsule 12  . zolmitriptan (ZOMIG-ZMT) 2.5 MG disintegrating tablet TAKE ONE TABLET BY MOUTH ONCE A DAY AS NEEDED FOR HEADACHE 6 tablet 0   No current facility-administered medications for this visit.    Family History  Problem Relation Age of Onset  . Hypertension Mother   . Osteoarthritis Mother   . Alzheimer's disease Mother   . Heart disease Father        Psychologist, forensic  . Atrial fibrillation Father   . Diabetes Maternal Grandmother   . Hypertension Maternal Grandmother   . Stroke Maternal Grandmother   . Uterine cancer Maternal Grandmother   . Heart disease Maternal Grandfather   . Cancer Other        uterine  . Breast cancer Neg Hx     Review of Systems  All other systems reviewed and are negative.   Exam:   BP 120/68   Pulse 76   Temp (!) 97.2 F (36.2 C) (Temporal)   Resp 12   Ht 5\' 4"  (1.626 m)   Wt 159 lb 9.6 oz (72.4 kg)   LMP 05/24/2012 (Approximate)   BMI 27.40 kg/m     General appearance: alert, cooperative and  appears stated age Head: normocephalic, without obvious abnormality, atraumatic Neck: no adenopathy, supple, symmetrical, trachea midline and thyroid normal to inspection and palpation Lungs: clear to auscultation bilaterally Breasts: normal appearance, no masses or tenderness, No nipple retraction or dimpling, No nipple discharge or bleeding, No axillary adenopathy Heart: regular rate and rhythm Abdomen: soft, non-tender; no masses, no organomegaly Extremities: extremities normal, atraumatic, no cyanosis or edema Skin: skin color, texture, turgor normal. No rashes or lesions Lymph nodes: cervical, supraclavicular, and axillary nodes normal. Neurologic: grossly normal  Pelvic: External genitalia:  no lesions              No abnormal inguinal nodes palpated.               Urethra:  normal appearing urethra with no masses, tenderness or lesions              Bartholins and Skenes: normal                 Vagina: normal appearing vagina with normal color and discharge, no lesions              Cervix: no lesions              Pap taken: Yes.   Bimanual Exam:  Uterus:  normal size, contour, position, consistency, mobility, non-tender              Adnexa: no mass, fullness, tenderness              Rectal exam: Yes.  .  Confirms.              Anus:  normal sphincter tone, no lesions  Chaperone was present for exam.  Assessment:   Well woman visit with normal exam. Migraine without aura.  HRT.  Life transition.   Plan: Mammogram screening discussed. Self breast awareness reviewed. Pap and HR HPV as above. Guidelines for Calcium, Vitamin D, regular exercise program including cardiovascular and weight bearing exercise. Refill of HRT.  Discused WHI and use of HRT which can increase risk of PE, DVT, MI, stroke and breast cancer.  Refill of Zomig.  List of PCPs and options for counseling/life coaching discussed.  Follow up annually and prn.   After visit summary provided.

## 2019-12-08 ENCOUNTER — Encounter: Payer: Self-pay | Admitting: Obstetrics and Gynecology

## 2019-12-08 ENCOUNTER — Ambulatory Visit (INDEPENDENT_AMBULATORY_CARE_PROVIDER_SITE_OTHER): Payer: 59 | Admitting: Obstetrics and Gynecology

## 2019-12-08 ENCOUNTER — Ambulatory Visit: Payer: 59 | Admitting: Certified Nurse Midwife

## 2019-12-08 VITALS — BP 120/68 | HR 76 | Temp 97.2°F | Resp 12 | Ht 64.0 in | Wt 159.6 lb

## 2019-12-08 DIAGNOSIS — Z01419 Encounter for gynecological examination (general) (routine) without abnormal findings: Secondary | ICD-10-CM | POA: Diagnosis not present

## 2019-12-08 DIAGNOSIS — G43009 Migraine without aura, not intractable, without status migrainosus: Secondary | ICD-10-CM

## 2019-12-08 DIAGNOSIS — Z79890 Hormone replacement therapy (postmenopausal): Secondary | ICD-10-CM

## 2019-12-08 DIAGNOSIS — N951 Menopausal and female climacteric states: Secondary | ICD-10-CM | POA: Diagnosis not present

## 2019-12-08 MED ORDER — ZOLMITRIPTAN 2.5 MG PO TBDP: ORAL_TABLET | ORAL | 2 refills | Status: DC

## 2019-12-08 MED ORDER — PROGESTERONE MICRONIZED 100 MG PO CAPS: 100.0000 mg | ORAL_CAPSULE | Freq: Every day | ORAL | 11 refills | Status: DC

## 2019-12-08 MED ORDER — ESTRADIOL 0.5 MG PO TABS: 0.5000 mg | ORAL_TABLET | Freq: Every day | ORAL | 11 refills | Status: DC

## 2019-12-08 NOTE — Patient Instructions (Signed)

## 2020-07-14 ENCOUNTER — Telehealth: Payer: Self-pay | Admitting: Family Medicine

## 2020-07-14 NOTE — Telephone Encounter (Signed)
Pt called in asking if Birdie Riddle would take her own as a new/TOC from [facility] as a new pt?   Please advise

## 2020-07-14 NOTE — Telephone Encounter (Signed)
Given the turnover upcoming in our office, I am going to have to absorb a large number of patients.  At this time I am unable to accept but if the situation changes and we have openings, I will definitely be open to the idea in the future

## 2020-07-14 NOTE — Telephone Encounter (Signed)
Pt is aware of this//ELEA

## 2020-11-16 ENCOUNTER — Other Ambulatory Visit: Payer: Self-pay | Admitting: Cardiology

## 2020-11-16 DIAGNOSIS — Z1231 Encounter for screening mammogram for malignant neoplasm of breast: Secondary | ICD-10-CM

## 2020-11-21 ENCOUNTER — Ambulatory Visit
Admission: RE | Admit: 2020-11-21 | Discharge: 2020-11-21 | Disposition: A | Discharge status: Home / Self Care | Payer: 59 | Source: Ambulatory Visit | Attending: Cardiology | Admitting: Cardiology

## 2020-11-21 ENCOUNTER — Other Ambulatory Visit: Payer: Self-pay

## 2020-11-21 DIAGNOSIS — Z1231 Encounter for screening mammogram for malignant neoplasm of breast: Secondary | ICD-10-CM

## 2020-11-28 ENCOUNTER — Telehealth: Payer: Self-pay

## 2020-11-28 NOTE — Telephone Encounter (Signed)
Would like to know if Dr. Jonni Sanger would take her on as a patient?

## 2020-11-29 NOTE — Telephone Encounter (Signed)
Patient is going to call back to schedule a TOC to [facility] or [facility].

## 2020-11-29 NOTE — Telephone Encounter (Signed)
I am closed to new patients unless they are a family member of one of my patients or an existing relationship with me and were referred. Does she fit these categories?

## 2020-12-11 NOTE — Progress Notes (Signed)
60 y.o. G55P0000 Married Caucasian female here for annual exam.    On HRT. She enjoys taking this.   Usually takes vit D 2000 IU.  Also takes a B complex vitamin.  FH of heart disease in mother and father, who is deceased.   Mother is now living in [place] at an assisted living facility.  She has dementia.  Patient has been a caregiver for years.   PCP:  None  Patient's last menstrual period was 05/24/2012 (approximate).           Sexually active: Yes.    The current method of family planning is post menopausal status.    Exercising: Yes.     pilates Smoker:  no  Health Maintenance: Pap:  12-02-18 Neg:Neg HR HPV, 09-01-15 Neg:Neg HR HPV, 07-01-12 Neg:Neg HR HPV History of abnormal Pap:  no MMG: 11-21-20 3D/Neg/BiRads1 Colonoscopy: 2020 8 polyps f/u 52yrs BMD:   2011  Result :Normal TDaP:  2018 Gardasil:   no HIV: 2018 NR Hep C: 2018 Neg Screening Labs:  today.    reports that she has never smoked. She has never used smokeless tobacco. She reports previous alcohol use. She reports that she does not use drugs.  Past Medical History:  Diagnosis Date   Migraine without aura since college   more regualr with menses, now in menopause and less frequent   Tachycardia 2000   occasional use of Atenol    Past Surgical History:  Procedure Laterality Date   LAPAROSCOPIC APPENDECTOMY N/A 01/27/2017   Procedure: APPENDECTOMY LAPAROSCOPIC;  Surgeon: Coralie Keens, MD;  Location: [facility];  Service: General;  Laterality: N/A;   WISDOM TOOTH EXTRACTION  age 34    Current Outpatient Medications  Medication Sig Dispense Refill   estradiol (ESTRACE) 0.5 MG tablet Take 1 tablet (0.5 mg total) by mouth daily. 30 tablet 11   progesterone (PROMETRIUM) 100 MG capsule Take 1 capsule (100 mg total) by mouth daily. 30 capsule 11   zolmitriptan (ZOMIG-ZMT) 2.5 MG disintegrating tablet TAKE ONE TABLET BY MOUTH ONCE A DAY AS NEEDED FOR HEADACHE 6 tablet 2   No current facility-administered medications  for this visit.    Family History  Problem Relation Age of Onset   Hypertension Mother    Osteoarthritis Mother    Alzheimer's disease Mother    Heart disease Father        pace maker   Atrial fibrillation Father    Diabetes Maternal Grandmother    Hypertension Maternal Grandmother    Stroke Maternal Grandmother    Uterine cancer Maternal Grandmother    Heart disease Maternal Grandfather    Cancer Other        uterine   Breast cancer Neg Hx     Review of Systems  All other systems reviewed and are negative.  Exam:   BP 122/62   Pulse 90   Ht 5' 3.25" (1.607 m)   Wt 163 lb (73.9 kg)   LMP 05/24/2012 (Approximate)   SpO2 96%   BMI 28.65 kg/m     General appearance: alert, cooperative and appears stated age Head: normocephalic, without obvious abnormality, atraumatic Neck: no adenopathy, supple, symmetrical, trachea midline and thyroid normal to inspection and palpation Lungs: clear to auscultation bilaterally Breasts: normal appearance, no masses or tenderness, No nipple retraction or dimpling, No nipple discharge or bleeding, No axillary adenopathy Heart: regular rate and rhythm Abdomen: soft, non-tender; no masses, no organomegaly Extremities: extremities normal, atraumatic, no cyanosis or edema Skin: skin color,  texture, turgor normal. No rashes or lesions Lymph nodes: cervical, supraclavicular, and axillary nodes normal. Neurologic: grossly normal  Pelvic: External genitalia:  no lesions              No abnormal inguinal nodes palpated.              Urethra:  normal appearing urethra with no masses, tenderness or lesions              Bartholins and Skenes: normal                 Vagina: normal appearing vagina with normal color and discharge, no lesions              Cervix: no lesions              Pap taken: no. Bimanual Exam:  Uterus:  normal size, contour, position, consistency, mobility, non-tender              Adnexa: no mass, fullness, tenderness               Rectal exam: yes.  Confirms.              Anus:  normal sphincter tone, no lesions  Chaperone was present for exam.  Assessment:   Well woman visit with normal exam. Migraine without aura. HRT.  Plan: Mammogram screening discussed. Self breast awareness reviewed. Pap and HR HPV as above. Guidelines for Calcium, Vitamin D, regular exercise program including cardiovascular and weight bearing exercise. Continue HRT.  Refills of oral estradiol and Prometrium for one year.  Discused WHI and use of HRT which can increase risk of PE, DVT, MI, stroke and breast cancer.  Refill of Zomig.  Routine labs.  Patient will establish care with a PCP.   Follow up annually and prn.

## 2020-12-12 ENCOUNTER — Encounter: Payer: Self-pay | Admitting: Obstetrics and Gynecology

## 2020-12-12 ENCOUNTER — Other Ambulatory Visit: Payer: Self-pay

## 2020-12-12 ENCOUNTER — Ambulatory Visit (INDEPENDENT_AMBULATORY_CARE_PROVIDER_SITE_OTHER): Payer: 59 | Admitting: Obstetrics and Gynecology

## 2020-12-12 VITALS — BP 122/62 | HR 90 | Ht 63.25 in | Wt 163.0 lb

## 2020-12-12 DIAGNOSIS — N951 Menopausal and female climacteric states: Secondary | ICD-10-CM | POA: Diagnosis not present

## 2020-12-12 DIAGNOSIS — G43009 Migraine without aura, not intractable, without status migrainosus: Secondary | ICD-10-CM | POA: Diagnosis not present

## 2020-12-12 DIAGNOSIS — Z79890 Hormone replacement therapy (postmenopausal): Secondary | ICD-10-CM | POA: Diagnosis not present

## 2020-12-12 DIAGNOSIS — Z01419 Encounter for gynecological examination (general) (routine) without abnormal findings: Secondary | ICD-10-CM | POA: Diagnosis not present

## 2020-12-12 MED ORDER — ZOLMITRIPTAN 2.5 MG PO TBDP: ORAL_TABLET | ORAL | 2 refills | Status: DC

## 2020-12-12 MED ORDER — PROGESTERONE MICRONIZED 100 MG PO CAPS: 100.0000 mg | ORAL_CAPSULE | Freq: Every day | ORAL | 3 refills | Status: DC

## 2020-12-12 MED ORDER — ESTRADIOL 0.5 MG PO TABS: 0.5000 mg | ORAL_TABLET | Freq: Every day | ORAL | 3 refills | Status: DC

## 2020-12-12 NOTE — Patient Instructions (Signed)

## 2020-12-13 LAB — COMPREHENSIVE METABOLIC PANEL
AG Ratio: 1.9 (calc) (ref 1.0–2.5)
ALT: 19 U/L (ref 6–29)
AST: 19 U/L (ref 10–35)
Albumin: 4.7 g/dL (ref 3.6–5.1)
Alkaline phosphatase (APISO): 61 U/L (ref 37–153)
BUN: 16 mg/dL (ref 7–25)
CO2: 25 mmol/L (ref 20–32)
Calcium: 10.4 mg/dL (ref 8.6–10.4)
Chloride: 104 mmol/L (ref 98–110)
Creat: 0.86 mg/dL (ref 0.50–1.05)
Globulin: 2.5 g/dL (calc) (ref 1.9–3.7)
Glucose, Bld: 84 mg/dL (ref 65–99)
Potassium: 4.1 mmol/L (ref 3.5–5.3)
Sodium: 139 mmol/L (ref 135–146)
Total Bilirubin: 0.4 mg/dL (ref 0.2–1.2)
Total Protein: 7.2 g/dL (ref 6.1–8.1)

## 2020-12-13 LAB — CBC
HCT: 42 % (ref 35.0–45.0)
Hemoglobin: 13.9 g/dL (ref 11.7–15.5)
MCH: 30.7 pg (ref 27.0–33.0)
MCHC: 33.1 g/dL (ref 32.0–36.0)
MCV: 92.7 fL (ref 80.0–100.0)
MPV: 10.8 fL (ref 7.5–12.5)
Platelets: 255 10*3/uL (ref 140–400)
RBC: 4.53 10*6/uL (ref 3.80–5.10)
RDW: 12.2 % (ref 11.0–15.0)
WBC: 6.7 10*3/uL (ref 3.8–10.8)

## 2020-12-13 LAB — LIPID PANEL
Cholesterol: 211 mg/dL — ABNORMAL HIGH (ref <200)
HDL: 93 mg/dL (ref >=50)
LDL Cholesterol (Calc): 99 mg/dL (calc)
Non-HDL Cholesterol (Calc): 118 mg/dL (calc) (ref <130)
Total CHOL/HDL Ratio: 2.3 (calc) (ref <5.0)
Triglycerides: 92 mg/dL (ref <150)

## 2020-12-13 LAB — VITAMIN D 25 HYDROXY (VIT D DEFICIENCY, FRACTURES): Vit D, 25-Hydroxy: 34 ng/mL (ref 30–100)

## 2020-12-13 LAB — TSH: TSH: 0.76 mIU/L (ref 0.40–4.50)

## 2021-04-03 ENCOUNTER — Ambulatory Visit (HOSPITAL_BASED_OUTPATIENT_CLINIC_OR_DEPARTMENT_OTHER): Payer: 59 | Admitting: Nurse Practitioner

## 2021-04-03 ENCOUNTER — Encounter (HOSPITAL_BASED_OUTPATIENT_CLINIC_OR_DEPARTMENT_OTHER): Payer: Self-pay | Admitting: Nurse Practitioner

## 2021-04-03 ENCOUNTER — Other Ambulatory Visit: Payer: Self-pay

## 2021-04-03 VITALS — BP 118/66 | HR 68 | Resp 12 | Ht 64.5 in | Wt 166.0 lb

## 2021-04-03 DIAGNOSIS — Z0000 Encounter for general adult medical examination without abnormal findings: Secondary | ICD-10-CM | POA: Diagnosis not present

## 2021-04-03 DIAGNOSIS — Z23 Encounter for immunization: Secondary | ICD-10-CM

## 2021-04-03 DIAGNOSIS — Z8249 Family history of ischemic heart disease and other diseases of the circulatory system: Secondary | ICD-10-CM | POA: Insufficient documentation

## 2021-04-03 DIAGNOSIS — Z636 Dependent relative needing care at home: Secondary | ICD-10-CM | POA: Insufficient documentation

## 2021-04-03 DIAGNOSIS — K635 Polyp of colon: Secondary | ICD-10-CM

## 2021-04-03 DIAGNOSIS — R002 Palpitations: Secondary | ICD-10-CM

## 2021-04-03 HISTORY — DX: Family history of ischemic heart disease and other diseases of the circulatory system: Z82.49

## 2021-04-03 HISTORY — DX: Dependent relative needing care at home: Z63.6

## 2021-04-03 MED ORDER — ZOSTER VAC RECOMB ADJUVANTED 50 MCG/0.5ML IM SUSR: 0.5000 mL | Freq: Once | INTRAMUSCULAR | 1 refills | Status: AC

## 2021-04-03 NOTE — Assessment & Plan Note (Signed)
Found on colonoscopy by Dr. Hilarie Fredrickson.  3 year follow-up due in January of next year.  Will send referral now so she can get on the schedule to have this completed. No concerns at this time.

## 2021-04-03 NOTE — Assessment & Plan Note (Signed)
Maternal and paternal family history of cardiovascular disease.  Father died of subdural hematoma from fall precipitated by suspect cardiac event.  Recent lipids excellent. No identifying concerning findings on evaluation today.  Given strong family history, cardiology referral placed for evaluation and recommendations for screening and lifestyle changes that may be beneficial to help protect cardiovascular health.

## 2021-04-03 NOTE — Progress Notes (Signed)
Orma Render, DNP, AGNP-c [facility] Care [facility] [address]  [place], [place] [address] [phone]  New patient visit   Patient: Sydney Padilla   DOB: 02-22-1961   60 y.o. Female  MRN: [id number] Visit Date: 04/03/2021  Patient Care Team: Jeannie Mallinger, Coralee Pesa, NP as PCP - General (Nurse Practitioner)  Today's healthcare provider: Orma Render, NP   Chief Complaint  Patient presents with   Sydney Padilla    Sydney Padilla is a 60 y.o. female who presents today as a new patient to establish care.  HPI   Colonoscopy Jan 2020 ([facility]) : 8 polyps found- due to go back in January 2023 OB GYN Dr. Quincy Simmonds: Last pap June 2022 May 2022 mammogram - normal  History of palpitations- previously on atenolol Strong family history of cardiac disease in both parents and grandparents.  She does have concerns about her risk factors and would like evaluation to see if she is at risk herself.   History of migraine headaches with frequent exacerbations in her 2's, but reports these have subsided to once about every 6 months since menopause.   She reports increased stress lately with caring for her mother with dementia.  She recently moved her mother to the area so she could be closer to her. Her father passed away unexpectedly several years ago.  She reports increased anxiety symptoms over her mothers health and feels that "small things" cause her to cry easily.  She is apprehensive about caregiver support because she feels that her situation is not as bad as others. She is still able to work and her mother is in an assisted living facility with help of outside caregivers. She does spend at least 3-4 evenings a week with her mother and time on the weekends.   Past Medical History:  Diagnosis Date   Acute bilateral low back pain without sciatica 03/18/2018   Backache 04/24/2010   Qualifier: Diagnosis of  By: Sherren Mocha MD, Dellis Filbert A    Migraine  without aura since college   more regualr with menses, now in menopause and less frequent   Tachycardia 2000   occasional use of Atenol   Past Surgical History:  Procedure Laterality Date   LAPAROSCOPIC APPENDECTOMY N/A 01/27/2017   Procedure: APPENDECTOMY LAPAROSCOPIC;  Surgeon: Coralie Keens, MD;  Location: [facility];  Service: General;  Laterality: N/A;   WISDOM TOOTH EXTRACTION  age 50   Family Status  Relation Name Status   Mother  Alive   Father  Deceased at age 72       subdural hematoma   Brother  Alive   MGM  Deceased   MGF  Deceased   PGM  Deceased at age 3       pnuemonia   PGF  Deceased at age 81's       illness ? consumption   Other MGGM (Not Specified)   Neg Hx  (Not Specified)   Family History  Problem Relation Age of Onset   Obesity Mother    Stroke Mother    Heart disease Mother    Hyperlipidemia Mother    Atrial fibrillation Mother    Hypertension Mother    Osteoarthritis Mother    Alzheimer's disease Mother    Hypertension Father    Hyperlipidemia Father    Heart disease Father        pace maker   Atrial fibrillation Father    Diabetes Maternal Grandmother  Hypertension Maternal Grandmother    Stroke Maternal Grandmother    Uterine cancer Maternal Grandmother    Heart disease Maternal Grandfather    Cancer Other        uterine   Breast cancer Neg Hx    Social History   Socioeconomic History   Marital status: Married    Spouse name: Not on file   Number of children: 0   Years of education: Not on file   Highest education level: Not on file  Occupational History    Employer: [employer]  Tobacco Use   Smoking status: Never   Smokeless tobacco: Never  Vaping Use   Vaping Use: Never used  Substance and Sexual Activity   Alcohol use: Not Currently    Comment: socially--2 drinks/month   Drug use: No   Sexual activity: Yes    Partners: Male    Birth control/protection: Post-menopausal  Other Topics Concern   Not on file  Social History  Narrative   Lives with husband.  Works at [employer].   Social Determinants of Health   Financial Resource Strain: Not on file  Food Insecurity: Not on file  Transportation Needs: Not on file  Physical Activity: Not on file  Stress: Not on file  Social Connections: Not on file   Outpatient Medications Prior to Visit  Medication Sig   estradiol (ESTRACE) 0.5 MG tablet Take 1 tablet (0.5 mg total) by mouth daily.   progesterone (PROMETRIUM) 100 MG capsule Take 1 capsule (100 mg total) by mouth daily.   zolmitriptan (ZOMIG-ZMT) 2.5 MG disintegrating tablet TAKE ONE TABLET BY MOUTH ONCE A DAY AS NEEDED FOR HEADACHE   No facility-administered medications prior to visit.   No Known Allergies  Immunization History  Administered Date(s) Administered   Janssen (J&J) SARS-COV-2 Vaccination 10/04/2019, 06/06/2020   Moderna SARS-COV2 Booster Vaccination 12/28/2020   Td 10/23/1999, 05/23/2005   Tdap 01/27/2017    Health Maintenance  Topic Date Due   Zoster Vaccines- Shingrix (1 of 2) Never done   COLONOSCOPY (Pts 45-25yrs Insurance coverage will need to be confirmed)  07/08/2021   MAMMOGRAM  11/21/2021   PAP SMEAR-Modifier  12/02/2023   TETANUS/TDAP  01/28/2027   COVID-19 Vaccine  Completed   HPV VACCINES  Aged Out   INFLUENZA VACCINE  Discontinued   Hepatitis C Screening  Discontinued   HIV Screening  Discontinued    Patient Care Team: Judye Lorino, Coralee Pesa, NP as PCP - General (Nurse Practitioner)  Review of Systems All review of systems negative except what is listed in the HPI    Objective    BP 118/66   Pulse 68   Resp 12   Ht 5' 4.5" (1.638 m)   Wt 166 lb (75.3 kg)   LMP 05/24/2012 (Approximate)   BMI 28.05 kg/m  Physical Exam Vitals and nursing note reviewed.  Constitutional:      Appearance: Normal appearance. She is normal weight.  HENT:     Head: Normocephalic.  Eyes:     Extraocular Movements: Extraocular movements intact.     Conjunctiva/sclera: Conjunctivae  normal.     Pupils: Pupils are equal, round, and reactive to light.  Neck:     Vascular: No carotid bruit.  Cardiovascular:     Rate and Rhythm: Normal rate and regular rhythm.     Pulses: Normal pulses.     Heart sounds: Normal heart sounds. No murmur heard. Pulmonary:     Effort: Pulmonary effort is normal.     Breath  sounds: Normal breath sounds.  Abdominal:     General: Abdomen is flat.     Palpations: Abdomen is soft.  Musculoskeletal:        General: Normal range of motion.     Cervical back: Normal range of motion. No tenderness.     Right lower leg: No edema.     Left lower leg: No edema.  Lymphadenopathy:     Cervical: No cervical adenopathy.  Skin:    General: Skin is warm and dry.     Capillary Refill: Capillary refill takes less than 2 seconds.  Neurological:     General: No focal deficit present.     Mental Status: She is alert and oriented to person, place, and time.  Psychiatric:        Mood and Affect: Mood normal.        Behavior: Behavior normal.        Thought Content: Thought content normal.        Judgment: Judgment normal.     Depression Screen PHQ 2/9 Scores 04/03/2021 01/27/2017  PHQ - 2 Score 0 0   No results found for any visits on 04/03/21.  Assessment & Plan      Problem List Items Addressed This Visit     Palpitations    Intermittent, mostly with anxiety Previously on atenolol, but not taking any longer No cardiac abnormalities noted today. Last lipids were excellent.  Will send cardiology referral at patient request for evaluation given strong family history on both sides of cardiovascular disease and death.       Encounter for medical examination to establish care - Primary    Review of current and past medical history, social history, medication, and family history.  Review of care gaps and health maintenance recommendations.  Records from recent providers to be requested if not available in Chart Review or Care Everywhere.   Recommendations for health maintenance, diet, and exercise provided.  Labs today: None- reviewed labs from July  HM Recommendations: colonoscopy order, shingles vaccine. Patient does not take the flu vaccine. Discussed COVID booster with new bi-valent.  CPE due: Next July to October.        Polyp of colon    Found on colonoscopy by Dr. Hilarie Fredrickson.  3 year follow-up due in January of next year.  Will send referral now so she can get on the schedule to have this completed. No concerns at this time.       Relevant Orders   Ambulatory referral to Gastroenterology   Family history of cardiac disorder    Maternal and paternal family history of cardiovascular disease.  Father died of subdural hematoma from fall precipitated by suspect cardiac event.  Recent lipids excellent. No identifying concerning findings on evaluation today.  Given strong family history, cardiology referral placed for evaluation and recommendations for screening and lifestyle changes that may be beneficial to help protect cardiovascular health.       Relevant Orders   Ambulatory referral to Cardiology   Need for shingles vaccine   Relevant Medications   Zoster Vaccine Adjuvanted [facility]) injection   Caregiver stress    Increased stress and anxiety over caring for her mother with dementia.  She is struggling with guilt of having this stress due to perception that she does not have the struggles that others have.  Explained to patient that caring for an aging parent can be emotionally difficult and life changing. Role changes required are hard to navigate and the loss  of the the way of living and the parent as she knew her can be incredibly trying.  I do feel that counseling services may help her navigate through this new phase of life and help her to accept and embrace her new responsibility and role as a daughter caring for an aging parent.  Referral placed today.       Relevant Orders   Ambulatory referral to  Psychology     Return for CPE next summer.     Time: 50minutes, >50% spent counseling, care coordination, chart review, and documentation.   Chakia Counts, Coralee Pesa, NP, DNP, AGNP-C [facility] at [facility]

## 2021-04-03 NOTE — Patient Instructions (Addendum)
Recommendations from today's visit: I would recommend vitamin D supplement of Vitamin D3 1000iU a day.  I have sent the referral to cardiology for evaluation I have sent the referral to Dr. Michail Sermon for counseling  Information on diet, exercise, and health maintenance recommendations are listed below. This is information to help you be sure you are on track for optimal health and monitoring.   Please look over this and let us know if you have any questions or if you have completed any of the health maintenance outside of [place] so that we can be sure your records are up to date.  ___________________________________________________________  Thank you for choosing [place] at [facility] for your Primary Care needs. I am excited for the opportunity to partner with you to meet your health care goals. It was a pleasure meeting you today!  I am an Adult-Geriatric Nurse Practitioner with a background in caring for patients for more than 20 years. I provide primary care and sports medicine services to patients age 34 and older within this office. I am also the director of the APP Fellowship with [facility].   I am passionate about providing the best service to you through preventive medicine and supportive care. I consider you a part of the medical team and value your input. I work diligently to ensure that you are heard and your needs are met in a safe and effective manner. I want you to feel comfortable with me as your provider and want you to know that your health concerns are important to me.  For your information, our office hours are Monday- Friday 8:00 AM - 5:00 PM At this time I am not in the office on Wednesdays.  If you have questions or concerns, please call our office at [phone] or send [place] a MyChart message and we will respond as quickly as possible.   For all urgent or time sensitive needs we ask that you please call the office to avoid delays. MyChart is  not constantly monitored and replies may take up to 72 business hours.  MyChart Policy: MyChart allows for you to see your visit notes, after visit summary, provider recommendations, lab and tests results, make an appointment, request refills, and contact your provider or the office for non-urgent questions or concerns. Providers are seeing patients during normal business hours and do not have built in time to review MyChart messages.  We ask that you allow a minimum of 4 business days for responses to [employer]. For this reason, please do not send urgent requests through [place]. Please call the office at [phone]. Complex MyChart concerns may require a visit. Your provider may request you schedule a virtual or in person visit to ensure we are providing the best care possible. MyChart messages sent after 4:00 PM on Friday will not be received by the provider until Monday morning.    Lab and Test Results: You will receive your lab and test results on MyChart as soon as they are completed and results have been sent by the lab or testing facility. Due to this service, you will receive your results BEFORE your provider.  I review lab and tests results each morning prior to seeing patients. Some results require collaboration with other providers to ensure you are receiving the most appropriate care. For this reason, we ask that you please allow a minimum of 4 business days for your provider to receive and review lab and test results and contact you about  these.  Most lab and test result comments from the provider will be sent through [place]. Your provider may recommend changes to the plan of care, follow-up visits, repeat testing, ask questions, or request an office visit to discuss these results. You may reply directly to this message or call the office at [phone] to provide information for the provider or set up an appointment. In some instances, you will be called with test results and  recommendations. Please let us know if this is preferred and we will make note of this in your chart to provide this for you.    If you have not heard a response to your lab or test results in 72 business hours, please call the office to let us know.   After Hours: For all non-emergency after hours needs, please call the office at [phone] and select the option to reach the on-call provider service. On-call services are shared between multiple [place] offices and therefore it will not be possible to speak directly with your provider. On-call providers may provide medical advice and recommendations, but are unable to provide refills for maintenance medications.  For all emergency or urgent medical needs after normal business hours, we recommend that you seek care at the closest Urgent Care or Emergency Department to ensure appropriate treatment in a timely manner.  [facility] [place] [facility] has a 24 hour emergency room located on the ground floor for your convenience.    Please do not hesitate to reach out to [place] with concerns.   Thank you, again, for choosing me as your health care partner. I appreciate your trust and look forward to learning more about you.   [name], DNP, AGNP-c ___________________________________________________________  Health Maintenance Recommendations Screening Testing Mammogram Every 1 -2 years based on history and risk factors Starting at age 28 Pap Smear Ages 21-39 every 3 years Ages 45-65 every 5 years with HPV testing More frequent testing may be required based on results and history Colon Cancer Screening Every 1-10 years based on test performed, risk factors, and history Starting at age 41 Bone Density Screening Every 2-10 years based on history Starting at age 40 for women Recommendations for men differ based on medication usage, history, and risk factors AAA Screening One time ultrasound Men 44-73 years old who have every  smoked Lung Cancer Screening Low Dose Lung CT every 12 months Age 67-80 years with a 30 pack-year smoking history who still smoke or who have quit within the last 15 years  Screening Labs Routine  Labs: Complete Blood Count (CBC), Complete Metabolic Panel (CMP), Cholesterol (Lipid Panel) Every 6-12 months based on history and medications May be recommended more frequently based on current conditions or previous results Hemoglobin A1c Lab Every 3-12 months based on history and previous results Starting at age 75 or earlier with diagnosis of diabetes, high cholesterol, BMI >26, and/or risk factors Frequent monitoring for patients with diabetes to ensure blood sugar control Thyroid Panel (TSH w/ T3 & T4) Every 6 months based on history, symptoms, and risk factors May be repeated more often if on medication HIV One time testing for all patients 50 and older May be repeated more frequently for patients with increased risk factors or exposure Hepatitis C One time testing for all patients 75 and older May be repeated more frequently for patients with increased risk factors or exposure Gonorrhea, Chlamydia Every 12 months for all sexually active persons 13-24 years Additional monitoring may be recommended for those who are  considered high risk or who have symptoms PSA Men 83-20 years old with risk factors Additional screening may be recommended from age 75-69 based on risk factors, symptoms, and history  Vaccine Recommendations Tetanus Booster All adults every 10 years Flu Vaccine All patients 6 months and older every year COVID Vaccine All patients 12 years and older Initial dosing with booster May recommend additional booster based on age and health history HPV Vaccine 2 doses all patients age 87-26 Dosing may be considered for patients over 26 Shingles Vaccine (Shingrix) 2 doses all adults 75 years and older Pneumonia (Pneumovax 23) All adults 71 years and older May recommend  earlier dosing based on health history Pneumonia (Prevnar 68) All adults 39 years and older Dosed 1 year after Pneumovax 23  Additional Screening, Testing, and Vaccinations may be recommended on an individualized basis based on family history, health history, risk factors, and/or exposure.  __________________________________________________________  Diet Recommendations for All Patients  I recommend that all patients maintain a diet low in saturated fats, carbohydrates, and cholesterol. While this can be challenging at first, it is not impossible and small changes can make big differences.  Things to try: Decreasing the amount of soda, sweet tea, and/or juice to one or less per day and replace with water While water is always the first choice, if you do not like water you may consider adding a water additive without sugar to improve the taste other sugar free drinks Replace potatoes with a brightly colored vegetable at dinner Use healthy oils, such as canola oil or olive oil, instead of butter or hard margarine Limit your bread intake to two pieces or less a day Replace regular pasta with low carb pasta options Bake, broil, or grill foods instead of frying Monitor portion sizes  Eat smaller, more frequent meals throughout the day instead of large meals  An important thing to remember is, if you love foods that are not great for your health, you don't have to give them up completely. Instead, allow these foods to be a reward when you have done well. Allowing yourself to still have special treats every once in a while is a nice way to tell yourself thank you for working hard to keep yourself healthy.   Also remember that every day is a new day. If you have a bad day and "fall off the wagon", you can still climb right back up and keep moving along on your journey!  We have resources available to help you!  Some websites that may be helpful  include: www.http://carter.biz/  Www.VeryWellFit.com _____________________________________________________________  Activity Recommendations for All Patients  I recommend that all adults get at least 20 minutes of moderate physical activity that elevates your heart rate at least 5 days out of the week.  Some examples include: Walking or jogging at a pace that allows you to carry on a conversation Cycling (stationary bike or outdoors) Water aerobics Yoga Weight lifting Dancing If physical limitations prevent you from putting stress on your joints, exercise in a pool or seated in a chair are excellent options.  Do determine your MAXIMUM heart rate for activity: YOUR AGE - 220 = MAX HeartRate   Remember! Do not push yourself too hard.  Start slowly and build up your pace, speed, weight, time in exercise, etc.  Allow your body to rest between exercise and get good sleep. You will need more water than normal when you are exerting yourself. Do not wait until you are thirsty to drink.  Drink with a purpose of getting in at least 8, 8 ounce glasses of water a day plus more depending on how much you exercise and sweat.    If you begin to develop dizziness, chest pain, abdominal pain, jaw pain, shortness of breath, headache, vision changes, lightheadedness, or other concerning symptoms, stop the activity and allow your body to rest. If your symptoms are severe, seek emergency evaluation immediately. If your symptoms are concerning, but not severe, please let us know so that we can recommend further evaluation.   ________________________________________________________________

## 2021-04-03 NOTE — Assessment & Plan Note (Signed)
Intermittent, mostly with anxiety Previously on atenolol, but not taking any longer No cardiac abnormalities noted today. Last lipids were excellent.  Will send cardiology referral at patient request for evaluation given strong family history on both sides of cardiovascular disease and death.

## 2021-04-03 NOTE — Assessment & Plan Note (Signed)
Increased stress and anxiety over caring for her mother with dementia.  She is struggling with guilt of having this stress due to perception that she does not have the struggles that others have.  Explained to patient that caring for an aging parent can be emotionally difficult and life changing. Role changes required are hard to navigate and the loss of the the way of living and the parent as she knew her can be incredibly trying.  I do feel that counseling services may help her navigate through this new phase of life and help her to accept and embrace her new responsibility and role as a daughter caring for an aging parent.  Referral placed today.

## 2021-04-03 NOTE — Assessment & Plan Note (Signed)
Review of current and past medical history, social history, medication, and family history.  Review of care gaps and health maintenance recommendations.  Records from recent providers to be requested if not available in Chart Review or Care Everywhere.  Recommendations for health maintenance, diet, and exercise provided.  Labs today: None- reviewed labs from July  HM Recommendations: colonoscopy order, shingles vaccine. Patient does not take the flu vaccine. Discussed COVID booster with new bi-valent.  CPE due: Next July to October.

## 2021-05-28 ENCOUNTER — Other Ambulatory Visit: Payer: Self-pay

## 2021-05-28 ENCOUNTER — Ambulatory Visit (HOSPITAL_BASED_OUTPATIENT_CLINIC_OR_DEPARTMENT_OTHER): Payer: 59 | Admitting: Cardiology

## 2021-05-28 ENCOUNTER — Encounter (HOSPITAL_BASED_OUTPATIENT_CLINIC_OR_DEPARTMENT_OTHER): Payer: Self-pay | Admitting: Cardiology

## 2021-05-28 VITALS — BP 108/68 | HR 76 | Ht 64.25 in | Wt 166.0 lb

## 2021-05-28 DIAGNOSIS — Z7189 Other specified counseling: Secondary | ICD-10-CM | POA: Diagnosis not present

## 2021-05-28 DIAGNOSIS — Z8249 Family history of ischemic heart disease and other diseases of the circulatory system: Secondary | ICD-10-CM

## 2021-05-28 DIAGNOSIS — I700 Atherosclerosis of aorta: Secondary | ICD-10-CM

## 2021-05-28 NOTE — Patient Instructions (Addendum)
Medication Instructions:  Your Physician recommend you continue on your current medication as directed.    *If you need a refill on your cardiac medications before your next appointment, please call your pharmacy*   Lab Work: None ordered today   Testing/Procedures: Dr. Harrell Gave has ordered a CT coronary calcium score. This test is done at [address]. This is $99 out of pocket.   Coronary CalciumScan A coronary calcium scan is an imaging test used to look for deposits of calcium and other fatty materials (plaques) in the inner lining of the blood vessels of the heart (coronary arteries). These deposits of calcium and plaques can partly clog and narrow the coronary arteries without producing any symptoms or warning signs. This puts a person at risk for a heart attack. This test can detect these deposits before symptoms develop. Tell a health care provider about: Any allergies you have. All medicines you are taking, including vitamins, herbs, eye drops, creams, and over-the-counter medicines. Any problems you or family members have had with anesthetic medicines. Any blood disorders you have. Any surgeries you have had. Any medical conditions you have. Whether you are pregnant or may be pregnant. What are the risks? Generally, this is a safe procedure. However, problems may occur, including: Harm to a pregnant woman and her unborn baby. This test involves the use of radiation. Radiation exposure can be dangerous to a pregnant woman and her unborn baby. If you are pregnant, you generally should not have this procedure done. Slight increase in the risk of cancer. This is because of the radiation involved in the test. What happens before the procedure? No preparation is needed for this procedure. What happens during the procedure? You will undress and remove any jewelry around your neck or chest. You will put on a hospital gown. Sticky electrodes will be placed on  your chest. The electrodes will be connected to an electrocardiogram (ECG) machine to record a tracing of the electrical activity of your heart. A CT scanner will take pictures of your heart. During this time, you will be asked to lie still and hold your breath for 2-3 seconds while a picture of your heart is being taken. The procedure may vary among health care providers and hospitals. What happens after the procedure? You can get dressed. You can return to your normal activities. It is up to you to get the results of your test. Ask your health care provider, or the department that is doing the test, when your results will be ready. Summary A coronary calcium scan is an imaging test used to look for deposits of calcium and other fatty materials (plaques) in the inner lining of the blood vessels of the heart (coronary arteries). Generally, this is a safe procedure. Tell your health care provider if you are pregnant or may be pregnant. No preparation is needed for this procedure. A CT scanner will take pictures of your heart. You can return to your normal activities after the scan is done. This information is not intended to replace advice given to you by your health care provider. Make sure you discuss any questions you have with your health care provider. Document Released: 12/07/2007 Document Revised: 04/29/2016 Document Reviewed: 04/29/2016 Elsevier Interactive Patient Education  2017 [place]: At [facility], you and your health needs are our priority.  As part of our continuing mission to provide you with exceptional heart care, we have created designated Provider Care Teams.  These Care Teams include your primary Cardiologist (physician) and Advanced Practice Providers (APPs -  Physician Assistants and Nurse Practitioners) who all work together to provide you with the care you need, when you need it.  We recommend signing up for the patient portal called "MyChart".   Sign up information is provided on this After Visit Summary.  MyChart is used to connect with patients for Virtual Visits (Telemedicine).  Patients are able to view lab/test results, encounter notes, upcoming appointments, etc.  Non-urgent messages can be sent to your provider as well.   To learn more about what you can do with MyChart, go to [email or web address].    Your next appointment:   Based on test results   The format for your next appointment:   In Person  Provider:   Buford Dresser, MD   Look into a [place] mobile.   K[employer]ia Mobile [employer]: Website: [email or web address]  DR. Harrell Gave RECOMMENDS YOU PURCHASE  " Kardia" [employer] FROM THE  GOOGLE[employer]  APP PLAY STORE.  THE APP IS FREE , BUT THE  EQUIPMENT HAS A COST. IT ALLOWS YOU TO OBTAIN A RECORDING OF YOUR HEART RATE AND RHYTHM BY PROVIDING A SHORT STRIP THAT YOU CAN SHARE WITH YOUR PROVIDER.

## 2021-05-28 NOTE — Progress Notes (Signed)
Cardiology Office Note:    Date:  05/28/2021   ID:  Sydney Padilla, DOB 1961-04-25, MRN [id number]  PCP:  Orma Render, NP  Cardiologist:  Buford Dresser, MD  Referring MD: Orma Render, NP   CC: new patient consultation for evaluation and management of cardiovascular risk  History of Present Illness:    Sydney Padilla is a 60 y.o. female with a hx of tachycardia who is seen as a new consult at the request of Early, Coralee Pesa, NP for the evaluation and management of cardiovascular risk indicated by family history of cardiac disorder.  On 04/03/2021 she saw Jacolyn Reedy, NP and reported intermittent palpitations, mostly occurring with anxiety. She was previously on atenolol. Cardiology referral was made at patient request given strong family history on both sides of cardiovascular disease and death. Father died of subdural hematoma from fall precipitated by suspect cardiac event.  Cardiovascular risk factors: Prior clinical ASCVD:  None  Comorbid conditions: Borderline hyperlipidemia but has significantly high good cholesterol. Acute kidney infections but no chronic issues. Metabolic syndrome/Obesity: Highest adult weight 173 lbs. Chronic inflammatory conditions: None Tobacco use history: "Dabbling in youth", does not smoke otherwise. Family history: Maternal grandfather died at 41 yo of 3rd or 4th heart attack, also had hx of being struck by lightning. Maternal grandmother had adult diabetes, smoker, died of stroke at 72 yo. Mother has atrial fibrillation, "mild" heart failure, PE (2018), no diabetes, currently 68 yo. Father died at 71 yo due to subdural hematoma, Afib in his 71;s, had pacemaker, lifelong smoker, never had heart attack. Paternal grandmother lived to 78 yo, lifelong smoker, no heart issues. Paternal grandfather died at 84 yo due to "pneumonia-type illness." Her brother has no known heart issues.. Prior cardiac testing and/or incidental findings on other testing (ie  coronary calcium): Distant episodes of tachycardia that would not resolve. She was started on atenolol. Currently she has been off atenolol for years. Any recurrent episodes of palpitations she typically attributes to caffeine and stress. Exercise level: Pilates 2-3 times a week, maximum 4 times. Activity is limited by work and visiting her mother at [facility] [place] assisted [facility]. Current diet: "T and 56" eating plan (no wheats, no sugary fruits, no sugars, pastas, or rice). Can eat quinoa and other grains. She has been following this plan for several years. Avoids all sugary drinks, will have unsweetened tea.  Occasionally she has minor episodes of lightheadedness or dizziness.  She denies any chest pain, or shortness of breath. No headaches, syncope, orthopnea, PND, lower extremity edema or exertional symptoms.  Past Medical History:  Diagnosis Date   Acute bilateral low back pain without sciatica 03/18/2018   Backache 04/24/2010   Qualifier: Diagnosis of  By: Sherren Mocha MD, Dellis Filbert A    Migraine without aura since college   more regualr with menses, now in menopause and less frequent   Tachycardia 2000   occasional use of Atenol    Past Surgical History:  Procedure Laterality Date   LAPAROSCOPIC APPENDECTOMY N/A 01/27/2017   Procedure: APPENDECTOMY LAPAROSCOPIC;  Surgeon: Coralie Keens, MD;  Location: [facility];  Service: General;  Laterality: N/A;   WISDOM TOOTH EXTRACTION  age 41    Current Medications: Current Outpatient Medications on File Prior to Visit  Medication Sig   estradiol (ESTRACE) 0.5 MG tablet Take 1 tablet (0.5 mg total) by mouth daily.   progesterone (PROMETRIUM) 100 MG capsule Take 1 capsule (100 mg total) by mouth daily.   zolmitriptan (ZOMIG-ZMT)  2.5 MG disintegrating tablet TAKE ONE TABLET BY MOUTH ONCE A DAY AS NEEDED FOR HEADACHE   No current facility-administered medications on file prior to visit.     Allergies:   Patient has no known allergies.   Social  History   Tobacco Use   Smoking status: Never   Smokeless tobacco: Never  Vaping Use   Vaping Use: Never used  Substance Use Topics   Alcohol use: Not Currently    Comment: socially--2 drinks/month   Drug use: No    Family History: family history includes Alzheimer's disease in her mother; Atrial fibrillation in her father and mother; Cancer in an other family member; Diabetes in her maternal grandmother; Heart disease in her father, maternal grandfather, and mother; Hyperlipidemia in her father and mother; Hypertension in her father, maternal grandmother, and mother; Obesity in her mother; Osteoarthritis in her mother; Stroke in her maternal grandmother and mother; Uterine cancer in her maternal grandmother. There is no history of Breast cancer.  ROS:   Please see the history of present illness.  Additional pertinent ROS: Constitutional: Negative for chills, fever, night sweats, unintentional weight loss. Positive for stress.  HENT: Negative for ear pain and hearing loss.   Eyes: Negative for loss of vision and eye pain.  Respiratory: Negative for cough, sputum, wheezing.   Cardiovascular: See HPI. Gastrointestinal: Negative for abdominal pain, melena, and hematochezia.  Genitourinary: Negative for dysuria and hematuria.  Musculoskeletal: Negative for falls and myalgias.  Skin: Negative for itching and rash.  Neurological: Negative for focal weakness, focal sensory changes and loss of consciousness.  Positive for occasional lightheadedness. Endo/Heme/Allergies: Does not bruise/bleed easily.     EKGs/Labs/Other Studies Reviewed:    The following studies were reviewed today:  CT Abdomen/Pelvis 01/27/2017: COMPARISON:  10/25/2008 CT abdomen and pelvis.   FINDINGS: Lower chest: No acute abnormality.   Hepatobiliary: Subcentimeter cyst in right lobe of liver, otherwise no focal liver abnormality is seen. No gallstones, gallbladder wall thickening, or biliary dilatation.    Pancreas: Unremarkable. No pancreatic ductal dilatation or surrounding inflammatory changes.   Spleen: Normal in size without focal abnormality.   Adrenals/Urinary Tract: Adrenal glands are unremarkable. Kidneys are normal, without renal calculi, focal lesion, or hydronephrosis. Bladder is unremarkable.   Stomach/Bowel: The appendix measures up to 15 mm and demonstrates enhancing wall thickening compatible with acute appendicitis. Surrounding inflammatory changes in periappendiceal fat. No evidence for perforation or abscess at this time.   Stomach, small bowel, and large bowel are otherwise unremarkable.   Vascular/Lymphatic: Aortic atherosclerosis. No enlarged abdominal or pelvic lymph nodes.   Reproductive: Uterus and bilateral adnexa are unremarkable.   Other: No abdominal wall hernia or abnormality. No abdominopelvic ascites.   Musculoskeletal: No acute or significant osseous findings.   IMPRESSION: Acute appendicitis. No evidence for perforation or abscess at this time.   EKG:  EKG is personally reviewed.   05/28/21: NSR at 76 bpm  Recent Labs: 12/12/2020: ALT 19; BUN 16; Creat 0.86; Hemoglobin 13.9; Platelets 255; Potassium 4.1; Sodium 139; TSH 0.76   Recent Lipid Panel    Component Value Date/Time   CHOL 211 (H) 12/12/2020 1606   CHOL 199 12/02/2018 1518   TRIG 92 12/12/2020 1606   HDL 93 12/12/2020 1606   HDL 91 12/02/2018 1518   CHOLHDL 2.3 12/12/2020 1606   VLDL 12.2 07/11/2015 1008   LDLCALC 99 12/12/2020 1606   LDLDIRECT 109.4 09/12/2008 0933    Physical Exam:    VS:  BP 108/68  Pulse 76   Ht 5' 4.25" (1.632 m)   Wt 166 lb (75.3 kg)   LMP 05/24/2012 (Approximate)   SpO2 98%   BMI 28.27 kg/m     Wt Readings from Last 3 Encounters:  05/28/21 166 lb (75.3 kg)  04/03/21 166 lb (75.3 kg)  12/12/20 163 lb (73.9 kg)    GEN: Well nourished, well developed in no acute distress HEENT: Normal, moist mucous membranes NECK: No JVD CARDIAC:  regular rhythm, normal S1 and S2, no rubs or gallops. No murmur. VASCULAR: Radial and DP pulses 2+ bilaterally. No carotid bruits RESPIRATORY:  Clear to auscultation without rales, wheezing or rhonchi  ABDOMEN: Soft, non-tender, non-distended MUSCULOSKELETAL:  Ambulates independently SKIN: Warm and dry, no edema NEUROLOGIC:  Alert and oriented x 3. No focal neuro deficits noted. PSYCHIATRIC:  Normal affect    ASSESSMENT:    1. Aortic atherosclerosis ([place])   2. Family history of heart disease   3. Cardiac risk counseling   4. Counseling on health promotion and disease prevention    PLAN:    CV risk Family history of heart disease Aortic atherosclerosis -we discussed the recommendations for management of her aortic atherosclerosis. While her ASCVD risk score is low, the presence of atherosclerosis suggests aspirin and statin are appropriate -we discussed medical management at length today -we discussed calcium score for additional information. After shared decision making, will get calcium score. If nonzero, would recommend aspirin and statin  Cardiac risk counseling and prevention recommendations: -recommend heart healthy/Mediterranean diet, with whole grains, fruits, vegetable, fish, lean meats, nuts, and olive oil. Limit salt. -recommend moderate walking, 3-5 times/week for 30-50 minutes each session. Aim for at least 150 minutes.week. Goal should be pace of 3 miles/hours, or walking 1.5 miles in 30 minutes -recommend avoidance of tobacco products. Avoid excess alcohol. -ASCVD risk score: The 10-year ASCVD risk score (Arnett DK, et al., 2019) is: 1.5%   Values used to calculate the score:     Age: 60 years     Sex: Female     Is Non-Hispanic African American: No     Diabetic: No     Tobacco smoker: No     Systolic Blood Pressure: 654 mmHg     Is BP treated: No     HDL Cholesterol: 93 mg/dL     Total Cholesterol: 211 mg/dL    Plan for follow up: TBD pending calcium  score.  Buford Dresser, MD, PhD, [facility]    Medication Adjustments/Labs and Tests Ordered: Current medicines are reviewed at length with the patient today.  Concerns regarding medicines are outlined above.   Orders Placed This Encounter  Procedures   CT CARDIAC SCORING (SELF PAY ONLY)   EKG 12-Lead    No orders of the defined types were placed in this encounter.   Patient Instructions  Medication Instructions:  Your Physician recommend you continue on your current medication as directed.    *If you need a refill on your cardiac medications before your next appointment, please call your pharmacy*   Lab Work: None ordered today   Testing/Procedures: Dr. Harrell Gave has ordered a CT coronary calcium score. This test is done at [address]. This is $99 out of pocket.   Coronary CalciumScan A coronary calcium scan is an imaging test used to look for deposits of calcium and other fatty materials (plaques) in the inner lining of the blood vessels of the heart (coronary arteries). These deposits  of calcium and plaques can partly clog and narrow the coronary arteries without producing any symptoms or warning signs. This puts a person at risk for a heart attack. This test can detect these deposits before symptoms develop. Tell a health care provider about: Any allergies you have. All medicines you are taking, including vitamins, herbs, eye drops, creams, and over-the-counter medicines. Any problems you or family members have had with anesthetic medicines. Any blood disorders you have. Any surgeries you have had. Any medical conditions you have. Whether you are pregnant or may be pregnant. What are the risks? Generally, this is a safe procedure. However, problems may occur, including: Harm to a pregnant woman and her unborn baby. This test involves the use of radiation. Radiation exposure can be dangerous to a pregnant woman and  her unborn baby. If you are pregnant, you generally should not have this procedure done. Slight increase in the risk of cancer. This is because of the radiation involved in the test. What happens before the procedure? No preparation is needed for this procedure. What happens during the procedure? You will undress and remove any jewelry around your neck or chest. You will put on a hospital gown. Sticky electrodes will be placed on your chest. The electrodes will be connected to an electrocardiogram (ECG) machine to record a tracing of the electrical activity of your heart. A CT scanner will take pictures of your heart. During this time, you will be asked to lie still and hold your breath for 2-3 seconds while a picture of your heart is being taken. The procedure may vary among health care providers and hospitals. What happens after the procedure? You can get dressed. You can return to your normal activities. It is up to you to get the results of your test. Ask your health care provider, or the department that is doing the test, when your results will be ready. Summary A coronary calcium scan is an imaging test used to look for deposits of calcium and other fatty materials (plaques) in the inner lining of the blood vessels of the heart (coronary arteries). Generally, this is a safe procedure. Tell your health care provider if you are pregnant or may be pregnant. No preparation is needed for this procedure. A CT scanner will take pictures of your heart. You can return to your normal activities after the scan is done. This information is not intended to replace advice given to you by your health care provider. Make sure you discuss any questions you have with your health care provider. Document Released: 12/07/2007 Document Revised: 04/29/2016 Document Reviewed: 04/29/2016 Elsevier Interactive Patient Education  2017 [place]: At [facility], you and your health needs are  our priority.  As part of our continuing mission to provide you with exceptional heart care, we have created designated Provider Care Teams.  These Care Teams include your primary Cardiologist (physician) and Advanced Practice Providers (APPs -  Physician Assistants and Nurse Practitioners) who all work together to provide you with the care you need, when you need it.  We recommend signing up for the patient portal called "MyChart".  Sign up information is provided on this After Visit Summary.  MyChart is used to connect with patients for Virtual Visits (Telemedicine).  Patients are able to view lab/test results, encounter notes, upcoming appointments, etc.  Non-urgent messages can be sent to your provider as well.   To learn more about what you can do with MyChart, go to  [email or web address].    Your next appointment:   Based on test results   The format for your next appointment:   In Person  Provider:   Buford Dresser, MD   Look into a [facility] mobile.   Kardia Mobile [employer]: Website: [email or web address]  DR. Harrell Gave RECOMMENDS YOU PURCHASE  " Kardia" [employer] FROM THE  GOOGLE[employer]  APP PLAY STORE.  THE APP IS FREE , BUT THE  EQUIPMENT HAS A COST. IT ALLOWS YOU TO OBTAIN A RECORDING OF YOUR HEART RATE AND RHYTHM BY PROVIDING A SHORT STRIP THAT YOU CAN SHARE WITH YOUR PROVIDER.      I,Mathew Stumpf,acting as a Education administrator for [employer], MD.,have documented all relevant documentation on the behalf of Buford Dresser, MD,as directed by  Buford Dresser, MD while in the presence of Buford Dresser, MD.  I, Buford Dresser, MD, have reviewed all documentation for this visit. The documentation on 07/10/21 for the exam, diagnosis, procedures, and orders are all accurate and complete.   Signed, Buford Dresser, MD PhD 05/28/2021 3:57 PM    [place]

## 2021-05-30 ENCOUNTER — Telehealth (HOSPITAL_BASED_OUTPATIENT_CLINIC_OR_DEPARTMENT_OTHER): Payer: Self-pay

## 2021-05-30 NOTE — Telephone Encounter (Signed)
Per [employer] called in for Tamiflu 75mg  BID x 5 days. 0 refills. Called into [facility]. Patient is aware the medication has been called in. [place]

## 2021-06-29 ENCOUNTER — Other Ambulatory Visit: Payer: Self-pay

## 2021-06-29 ENCOUNTER — Ambulatory Visit (INDEPENDENT_AMBULATORY_CARE_PROVIDER_SITE_OTHER)
Admission: RE | Admit: 2021-06-29 | Discharge: 2021-06-29 | Disposition: A | Discharge status: Home / Self Care | Payer: Self-pay | Source: Ambulatory Visit | Attending: Cardiology | Admitting: Cardiology

## 2021-06-29 DIAGNOSIS — Z8249 Family history of ischemic heart disease and other diseases of the circulatory system: Secondary | ICD-10-CM

## 2021-06-29 DIAGNOSIS — I700 Atherosclerosis of aorta: Secondary | ICD-10-CM

## 2021-07-06 ENCOUNTER — Telehealth: Payer: Self-pay | Admitting: Cardiology

## 2021-07-06 NOTE — Telephone Encounter (Signed)
Pt made aware of results along with MD's recommendations. Pt state she would prefer to discuss options with Dr. Harrell Gave first.   Appointment scheduled for 2/3  Buford Dresser, MD  07/02/2021  8:36 AM EST     Only one tiny spot of calcium, score 3.6. This means there is not a lot of plaque in the vessels, but there is some. I recommend aggressive prevention for any calcium that we see, especially given family history and the plaque seen in the aorta. Happy to see her back to discuss results further and discuss management options with statins if she would prefer. Otherwise I would start rosuvastatin 10 mg daily, recheck lipids and LFTs in 2 mos, and see me back in a year. Thanks.

## 2021-07-06 NOTE — Telephone Encounter (Signed)
Patient is returning Alisha's call in regards to her CT results.

## 2021-07-10 ENCOUNTER — Encounter (HOSPITAL_BASED_OUTPATIENT_CLINIC_OR_DEPARTMENT_OTHER): Payer: Self-pay | Admitting: Cardiology

## 2021-07-26 ENCOUNTER — Other Ambulatory Visit: Payer: Self-pay

## 2021-07-26 ENCOUNTER — Ambulatory Visit (HOSPITAL_BASED_OUTPATIENT_CLINIC_OR_DEPARTMENT_OTHER): Payer: 59 | Admitting: Cardiology

## 2021-07-26 ENCOUNTER — Encounter (HOSPITAL_BASED_OUTPATIENT_CLINIC_OR_DEPARTMENT_OTHER): Payer: Self-pay | Admitting: Cardiology

## 2021-07-26 VITALS — BP 110/62 | HR 63 | Ht 64.25 in | Wt 169.8 lb

## 2021-07-26 DIAGNOSIS — Z712 Person consulting for explanation of examination or test findings: Secondary | ICD-10-CM | POA: Diagnosis not present

## 2021-07-26 DIAGNOSIS — Z7189 Other specified counseling: Secondary | ICD-10-CM | POA: Diagnosis not present

## 2021-07-26 DIAGNOSIS — Z8249 Family history of ischemic heart disease and other diseases of the circulatory system: Secondary | ICD-10-CM | POA: Diagnosis not present

## 2021-07-26 DIAGNOSIS — I2510 Atherosclerotic heart disease of native coronary artery without angina pectoris: Secondary | ICD-10-CM | POA: Diagnosis not present

## 2021-07-26 DIAGNOSIS — Z79899 Other long term (current) drug therapy: Secondary | ICD-10-CM

## 2021-07-26 MED ORDER — ROSUVASTATIN CALCIUM 5 MG PO TABS: 5.0000 mg | ORAL_TABLET | Freq: Every day | ORAL | 3 refills | Status: DC

## 2021-07-26 MED ORDER — ASPIRIN EC 81 MG PO TBEC: 81.0000 mg | DELAYED_RELEASE_TABLET | Freq: Every day | ORAL | 3 refills | Status: DC

## 2021-07-26 NOTE — Patient Instructions (Signed)
Medication Instructions:  1) START: Aspirin 81 mg daily 2) START: Rosuvastatin 5 mg daily  *If you need a refill on your cardiac medications before your next appointment, please call your pharmacy*   Lab Work: Your provider has recommended lab work in May, 2023 (Fasting Lipid, CMP). Please have this collected at [facility] at [place]. The lab is open 8:00 am - 4:30 pm. Please avoid 12:00p - 1:00p for lunch hour. You do not need an appointment. Please go to [address] [place], [place] [address]. This is in the Primary Care office on the 3rd floor, let them know you are there for blood work and they will direct you to the lab.  If you have labs (blood work) drawn today and your tests are completely normal, you will receive your results only by: [place] (if you have MyChart) OR A paper copy in the mail If you have any lab test that is abnormal or we need to change your treatment, we will call you to review the results.   Testing/Procedures: None ordered today   Follow-Up: At [facility], you and your health needs are our priority.  As part of our continuing mission to provide you with exceptional heart care, we have created designated Provider Care Teams.  These Care Teams include your primary Cardiologist (physician) and Advanced Practice Providers (APPs -  Physician Assistants and Nurse Practitioners) who all work together to provide you with the care you need, when you need it.  We recommend signing up for the patient portal called "MyChart".  Sign up information is provided on this After Visit Summary.  MyChart is used to connect with patients for Virtual Visits (Telemedicine).  Patients are able to view lab/test results, encounter notes, upcoming appointments, etc.  Non-urgent messages can be sent to your provider as well.   To learn more about what you can do with MyChart, go to [email or web address].    Your next appointment:   2  year(s)  The format for your next appointment:   In Person  Provider:   Buford Dresser, MD

## 2021-07-26 NOTE — Progress Notes (Signed)
Cardiology Office Note:    Date:  07/26/2021   ID:  AARIN SPARKMAN, DOB 06/30/1960, MRN [id number]  PCP:  Tollie Eth, NP  Cardiologist:  Jodelle Red, MD  Referring MD: Tollie Eth, NP   CC: follow up  History of Present Illness:    ISSIS LINDSETH is a 61 y.o. female with a hx of tachycardia who is seen for followup today. I initially met her 05/28/21 as a new consult at the request of Early, Sung Amabile, NP for the evaluation and management of cardiovascular risk indicated by family history of cardiac disorder.  On 04/03/2021 she saw Enid Skeens, NP and reported intermittent palpitations, mostly occurring with anxiety. She was previously on atenolol. Cardiology referral was made at patient request given strong family history on both sides of cardiovascular disease and death. Father died of subdural hematoma from fall precipitated by suspect cardiac event.  Family history: Maternal grandfather died at 46 yo of 3rd or 4th heart attack, also had hx of being struck by lightning. Maternal grandmother had adult diabetes, smoker, died of stroke at 24 yo. Mother has atrial fibrillation, "mild" heart failure, PE (2018), no diabetes, currently 74 yo. Father died at 19 yo due to subdural hematoma, Afib in his 77;s, had pacemaker, lifelong smoker, never had heart attack. Paternal grandmother lived to 72 yo, lifelong smoker, no heart issues. Paternal grandfather died at 72 yo due to "pneumonia-type illness." Her brother has no known heart issues.. Exercise level: Pilates 2-3 times a week, maximum 4 times. Activity is limited by work and visiting her mother at [facility]. Current diet: "T and 56" eating plan (no wheats, no sugary fruits, no sugars, pastas, or rice). Can eat quinoa and other grains. She has been following this plan for several years. Avoids all sugary drinks, will have unsweetened tea.  Today: Here to discuss results of calcium score and next steps. We reviewed  her actual images together today. We discussed recommendations for management. Reviewed cholesterol biology, plaque formation, recommendation for statin at length.  Denies chest pain, shortness of breath at rest or with normal exertion. No PND, orthopnea, LE edema or unexpected weight gain. No syncope or palpitations.   Past Medical History:  Diagnosis Date   Acute bilateral low back pain without sciatica 03/18/2018   Backache 04/24/2010   Qualifier: Diagnosis of  By: Tawanna Cooler MD, Tinnie Gens A    Migraine without aura since college   more regualr with menses, now in menopause and less frequent   Tachycardia 2000   occasional use of Atenol    Past Surgical History:  Procedure Laterality Date   LAPAROSCOPIC APPENDECTOMY N/A 01/27/2017   Procedure: APPENDECTOMY LAPAROSCOPIC;  Surgeon: Abigail Miyamoto, MD;  Location: [facility] OR;  Service: General;  Laterality: N/A;   WISDOM TOOTH EXTRACTION  age 56    Current Medications: Current Outpatient Medications on File Prior to Visit  Medication Sig   estradiol (ESTRACE) 0.5 MG tablet Take 1 tablet (0.5 mg total) by mouth daily.   progesterone (PROMETRIUM) 100 MG capsule Take 1 capsule (100 mg total) by mouth daily.   zolmitriptan (ZOMIG-ZMT) 2.5 MG disintegrating tablet TAKE ONE TABLET BY MOUTH ONCE A DAY AS NEEDED FOR HEADACHE   No current facility-administered medications on file prior to visit.     Allergies:   Patient has no known allergies.   Social History   Tobacco Use   Smoking status: Never   Smokeless tobacco: Never  Vaping Use  Vaping Use: Never used  Substance Use Topics   Alcohol use: Not Currently    Comment: socially--2 drinks/month   Drug use: No    Family History: family history includes Alzheimer's disease in her mother; Atrial fibrillation in her father and mother; Cancer in an other family member; Diabetes in her maternal grandmother; Heart disease in her father, maternal grandfather, and mother; Hyperlipidemia in her  father and mother; Hypertension in her father, maternal grandmother, and mother; Obesity in her mother; Osteoarthritis in her mother; Stroke in her maternal grandmother and mother; Uterine cancer in her maternal grandmother. There is no history of Breast cancer.  ROS:   Please see the history of present illness.  Additional pertinent ROS unremarkable.  EKGs/Labs/Other Studies Reviewed:    The following studies were reviewed today:  Calcium score 06/29/21 Coronary calcium score of 3.6. This was 81 percentile for age-, race-, and sex-matched controls.  CT Abdomen/Pelvis 01/27/2017:  Vascular/Lymphatic: Aortic atherosclerosis. No enlarged abdominal or pelvic lymph nodes.  IMPRESSION: Acute appendicitis. No evidence for perforation or abscess at this time.   EKG:  EKG is personally reviewed.   05/28/21: NSR at 76 bpm  Recent Labs: 12/12/2020: ALT 19; BUN 16; Creat 0.86; Hemoglobin 13.9; Platelets 255; Potassium 4.1; Sodium 139; TSH 0.76   Recent Lipid Panel    Component Value Date/Time   CHOL 211 (H) 12/12/2020 1606   CHOL 199 12/02/2018 1518   TRIG 92 12/12/2020 1606   HDL 93 12/12/2020 1606   HDL 91 12/02/2018 1518   CHOLHDL 2.3 12/12/2020 1606   VLDL 12.2 07/11/2015 1008   LDLCALC 99 12/12/2020 1606   LDLDIRECT 109.4 09/12/2008 0933    Physical Exam:    VS:  BP 110/62 (BP Location: Right Arm, Patient Position: Sitting, Cuff Size: Normal)    Pulse 63    Ht 5' 4.25" (1.632 m)    Wt 169 lb 12.8 oz (77 kg)    LMP 05/24/2012 (Approximate)    SpO2 98%    BMI 28.92 kg/m     Wt Readings from Last 3 Encounters:  07/26/21 169 lb 12.8 oz (77 kg)  05/28/21 166 lb (75.3 kg)  04/03/21 166 lb (75.3 kg)    GEN: Well nourished, well developed in no acute distress HEENT: Normal, moist mucous membranes NECK: No JVD CARDIAC: regular rhythm, normal S1 and S2, no rubs or gallops. No murmur. VASCULAR: Radial and DP pulses 2+ bilaterally. No carotid bruits RESPIRATORY:  Clear to  auscultation without rales, wheezing or rhonchi  ABDOMEN: Soft, non-tender, non-distended MUSCULOSKELETAL:  Ambulates independently SKIN: Warm and dry, no edema NEUROLOGIC:  Alert and oriented x 3. No focal neuro deficits noted. PSYCHIATRIC:  Normal affect    ASSESSMENT:    1. Encounter to discuss test results   2. Coronary artery calcification seen on CT scan   3. Nonocclusive coronary atherosclerosis of native coronary artery   4. Family history of heart disease   5. Cardiac risk counseling   6. Counseling on health promotion and disease prevention   7. Medication management     PLAN:    CV risk Family history of heart disease Aortic atherosclerosis Coronary calcification on CT, consistent with nonobstructive CAD -we reviewed her calcium score and aortic atherosclerosis -LDL 99, goal <70 -we discussed statin, she is amenable. Will start low dose rosuvastatin 5 mg to make sure she tolerates, if not at goal would increase to 10 mg rosuvastatin in 3 mos -we discussed aspirin as well, no bleeding issues,  will start  Cardiac risk counseling and prevention recommendations: -recommend heart healthy/Mediterranean diet, with whole grains, fruits, vegetable, fish, lean meats, nuts, and olive oil. Limit salt. -recommend moderate walking, 3-5 times/week for 30-50 minutes each session. Aim for at least 150 minutes.week. Goal should be pace of 3 miles/hours, or walking 1.5 miles in 30 minutes -recommend avoidance of tobacco products. Avoid excess alcohol. -ASCVD risk score: The 10-year ASCVD risk score (Arnett DK, et al., 2019) is: 1.8%   Values used to calculate the score:     Age: 37 years     Sex: Female     Is Non-Hispanic African American: No     Diabetic: No     Tobacco smoker: No     Systolic Blood Pressure: 175 mmHg     Is BP treated: No     HDL Cholesterol: 93 mg/dL     Total Cholesterol: 211 mg/dL    Plan for follow up: 2 years or sooner as needed  Buford Dresser,  MD, PhD, [facility]    Medication Adjustments/Labs and Tests Ordered: Current medicines are reviewed at length with the patient today.  Concerns regarding medicines are outlined above.   Orders Placed This Encounter  Procedures   Lipid panel   Comprehensive metabolic panel    Meds ordered this encounter  Medications   rosuvastatin (CRESTOR) 5 MG tablet    Sig: Take 1 tablet (5 mg total) by mouth daily.    Dispense:  90 tablet    Refill:  3   aspirin EC 81 MG tablet    Sig: Take 1 tablet (81 mg total) by mouth daily. Swallow whole.    Dispense:  90 tablet    Refill:  3    Patient Instructions  Medication Instructions:  1) START: Aspirin 81 mg daily 2) START: Rosuvastatin 5 mg daily  *If you need a refill on your cardiac medications before your next appointment, please call your pharmacy*   Lab Work: Your provider has recommended lab work in May, 2023 (Fasting Lipid, CMP). Please have this collected at [facility] at [place]. The lab is open 8:00 am - 4:30 pm. Please avoid 12:00p - 1:00p for lunch hour. You do not need an appointment. Please go to [address] [employer] [place], [place] [address]. This is in the Primary Care office on the 3rd floor, let them know you are there for blood work and they will direct you to the lab.  If you have labs (blood work) drawn today and your tests are completely normal, you will receive your results only by: Candelaria Arenas (if you have MyChart) OR A paper copy in the mail If you have any lab test that is abnormal or we need to change your treatment, we will call you to review the results.   Testing/Procedures: None ordered today   Follow-Up: At [facility], you and your health needs are our priority.  As part of our continuing mission to provide you with exceptional heart care, we have created designated Provider Care Teams.  These Care Teams include your primary Cardiologist (physician) and  Advanced Practice Providers (APPs -  Physician Assistants and Nurse Practitioners) who all work together to provide you with the care you need, when you need it.  We recommend signing up for the patient portal called "MyChart".  Sign up information is provided on this After Visit Summary.  MyChart is used to connect with patients for Virtual Visits (Telemedicine).  Patients are able to view lab/test results, encounter notes, upcoming appointments, etc.  Non-urgent messages can be sent to your provider as well.   To learn more about what you can do with MyChart, go to [email or web address].    Your next appointment:   2 year(s)  The format for your next appointment:   In Person  Provider:   Buford Dresser, MD      Signed, Buford Dresser, MD PhD 07/26/2021 7:22 PM    [place]

## 2021-08-14 ENCOUNTER — Encounter: Payer: Self-pay | Admitting: Internal Medicine

## 2021-09-10 ENCOUNTER — Encounter: Payer: Self-pay | Admitting: Internal Medicine

## 2021-09-26 ENCOUNTER — Ambulatory Visit (AMBULATORY_SURGERY_CENTER): Payer: 59

## 2021-09-26 VITALS — Ht 64.5 in | Wt 155.0 lb

## 2021-09-26 DIAGNOSIS — Z86010 Personal history of colonic polyps: Secondary | ICD-10-CM

## 2021-09-26 MED ORDER — NA SULFATE-K SULFATE-MG SULF 17.5-3.13-1.6 GM/177ML PO SOLN: 1.0000 | Freq: Once | ORAL | 0 refills | Status: AC

## 2021-09-26 NOTE — Progress Notes (Signed)
No egg or soy allergy known to patient  ?No issues known to pt with past sedation with any surgeries or procedures ?Patient denies ever being told they had issues or difficulty with intubation  ?No FH of Malignant Hyperthermia ?Pt is not on diet pills ?Pt is not on home 02  ?Pt is not on blood thinners  ?Pt denies issues with constipation;  ?No A fib or A flutter ?Patient advised to use SingleCare for prep cost reduction; ? NO PA's for preps discussed with pt in PV today  ?Discussed with pt there will be an out-of-pocket cost for prep and that varies from $0 to 70 + dollars - pt verbalized understanding  ?Due to the COVID-19 pandemic we are asking patients to follow certain guidelines in PV and the [place]   ?Pt aware of COVID protocols and [facility] guidelines  ?PV completed over the phone. Pt verified name, DOB, address and insurance during PV today.  ?Pt mailed instruction packet with copy of consent form to read and not return, and instructions.  ?Pt encouraged to call with questions or issues.  ?If pt has My chart, procedure instructions sent via My Chart   ?

## 2021-10-17 ENCOUNTER — Encounter: Payer: Self-pay | Admitting: Internal Medicine

## 2021-10-22 ENCOUNTER — Encounter (HOSPITAL_BASED_OUTPATIENT_CLINIC_OR_DEPARTMENT_OTHER): Payer: Self-pay

## 2021-10-23 ENCOUNTER — Other Ambulatory Visit: Payer: Self-pay | Admitting: Nurse Practitioner

## 2021-10-23 DIAGNOSIS — Z1231 Encounter for screening mammogram for malignant neoplasm of breast: Secondary | ICD-10-CM

## 2021-10-26 ENCOUNTER — Encounter: Payer: Self-pay | Admitting: Internal Medicine

## 2021-10-26 ENCOUNTER — Ambulatory Visit (AMBULATORY_SURGERY_CENTER): Payer: 59 | Admitting: Internal Medicine

## 2021-10-26 VITALS — BP 94/50 | HR 70 | Temp 98.4°F | Resp 20 | Ht 64.0 in | Wt 155.0 lb

## 2021-10-26 DIAGNOSIS — Z86010 Personal history of colonic polyps: Secondary | ICD-10-CM

## 2021-10-26 DIAGNOSIS — D125 Benign neoplasm of sigmoid colon: Secondary | ICD-10-CM

## 2021-10-26 DIAGNOSIS — K635 Polyp of colon: Secondary | ICD-10-CM

## 2021-10-26 MED ORDER — SODIUM CHLORIDE 0.9 % IV SOLN: 500.0000 mL | Freq: Once | INTRAVENOUS | Status: DC

## 2021-10-26 NOTE — Progress Notes (Signed)
Called to room to assist during endoscopic procedure.  Patient ID and intended procedure confirmed with present staff. Received instructions for my participation in the procedure from the performing physician.  

## 2021-10-26 NOTE — Progress Notes (Signed)
? ?GASTROENTEROLOGY PROCEDURE H&P NOTE  ? ?Primary Care Physician: ?Orma Render, NP ? ? ? ?Reason for Procedure:  History of sessile serrated colon polyps ? ?Plan:    Surveillance colonoscopy ? ?Patient is appropriate for endoscopic procedure(s) in the ambulatory ([facility]) setting. ? ?The nature of the procedure, as well as the risks, benefits, and alternatives were carefully and thoroughly reviewed with the patient. Ample time for discussion and questions allowed. The patient understood, was satisfied, and agreed to proceed.  ? ? ? ?HPI: ?Sydney Padilla is a 61 y.o. female who presents for surveillance colonoscopy.  Medical history as below.  Tolerated the prep.  No recent chest pain or shortness of breath.  No abdominal pain today. ? ?Past Medical History:  ?Diagnosis Date  ? Acute bilateral low back pain without sciatica 03/18/2018  ? Backache 04/24/2010  ? Qualifier: Diagnosis of  By: Sherren Mocha MD, Jory Ee   ? Migraine without aura since college  ? more regualr with menses, now in menopause and less frequent  ? Seasonal allergies   ? Tachycardia 2000  ? occasional use of Atenol  ? ? ?Past Surgical History:  ?Procedure Laterality Date  ? COLONOSCOPY  2020  ? JMP-MAC-suprep(good)-TA  ? LAPAROSCOPIC APPENDECTOMY N/A 01/27/2017  ? Procedure: APPENDECTOMY LAPAROSCOPIC;  Surgeon: Coralie Keens, MD;  Location: [facility];  Service: General;  Laterality: N/A;  ? POLYPECTOMY  2020  ? TA  ? [place] EXTRACTION  1982  ? ? ?Prior to Admission medications   ?Medication Sig Start Date End Date Taking? Authorizing Provider  ?Ascorbic Acid (VITAMIN C PO) Take 1 tablet by mouth daily at 6 (six) AM.   Yes [provider]  ?B Complex Vitamins (VITAMIN B COMPLEX PO) Take 1 tablet by mouth daily at 6 (six) AM.   Yes [provider]  ?estradiol (ESTRACE) 0.5 MG tablet Take 1 tablet (0.5 mg total) by mouth daily. 12/12/20  Yes Nunzio Cobbs, MD  ?progesterone (PROMETRIUM) 100 MG capsule Take 1 capsule  (100 mg total) by mouth daily. 12/12/20  Yes Amundson Raliegh Ip, MD  ?VITAMIN D PO Take 1 tablet by mouth daily at 6 (six) AM.   Yes [provider]  ?zolmitriptan (ZOMIG-ZMT) 2.5 MG disintegrating tablet TAKE ONE TABLET BY MOUTH ONCE A DAY AS NEEDED FOR HEADACHE 12/12/20  Yes Nunzio Cobbs, MD  ?aspirin 81 MG EC tablet Take 81 mg by mouth daily.    [provider]  ?rosuvastatin (CRESTOR) 5 MG tablet Take 1 tablet (5 mg total) by mouth daily. ?Patient not taking: Reported on 09/26/2021 07/26/21 07/21/22  Buford Dresser, MD  ? ? ?Current Outpatient Medications  ?Medication Sig Dispense Refill  ? Ascorbic Acid (VITAMIN C PO) Take 1 tablet by mouth daily at 6 (six) AM.    ? B Complex Vitamins (VITAMIN B COMPLEX PO) Take 1 tablet by mouth daily at 6 (six) AM.    ? estradiol (ESTRACE) 0.5 MG tablet Take 1 tablet (0.5 mg total) by mouth daily. 90 tablet 3  ? progesterone (PROMETRIUM) 100 MG capsule Take 1 capsule (100 mg total) by mouth daily. 90 capsule 3  ? VITAMIN D PO Take 1 tablet by mouth daily at 6 (six) AM.    ? zolmitriptan (ZOMIG-ZMT) 2.5 MG disintegrating tablet TAKE ONE TABLET BY MOUTH ONCE A DAY AS NEEDED FOR HEADACHE 6 tablet 2  ? aspirin 81 MG EC tablet Take 81 mg by mouth daily.    ?  rosuvastatin (CRESTOR) 5 MG tablet Take 1 tablet (5 mg total) by mouth daily. (Patient not taking: Reported on 09/26/2021) 90 tablet 3  ? ?Current Facility-Administered Medications  ?Medication Dose Route Frequency Provider Last Rate Last Admin  ? 0.9 %  sodium chloride infusion  500 mL Intravenous Once Micheale Schlack, Lajuan Lines, MD      ? ? ?Allergies as of 10/26/2021  ? (No Known Allergies)  ? ? ?Family History  ?Problem Relation Age of Onset  ? Colon polyps Mother 58  ? Obesity Mother   ? Stroke Mother   ? Heart disease Mother   ? Hyperlipidemia Mother   ? Atrial fibrillation Mother   ? Hypertension Mother   ? Osteoarthritis Mother   ? Alzheimer's disease Mother   ? Hypertension Father   ?  Hyperlipidemia Father   ? Heart disease Father   ?     pace maker  ? Atrial fibrillation Father   ? Diabetes Maternal Grandmother   ? Hypertension Maternal Grandmother   ? Stroke Maternal Grandmother   ? Uterine cancer Maternal Grandmother   ? Heart disease Maternal Grandfather   ? Cancer Other   ?     uterine  ? Breast cancer Neg Hx   ? Colon cancer Neg Hx   ? Hemochromatosis Neg Hx   ? Esophageal cancer Neg Hx   ? Rectal cancer Neg Hx   ? Stomach cancer Neg Hx   ? ? ?Social History  ? ?Socioeconomic History  ? Marital status: Married  ?  Spouse name: Not on file  ? Number of children: 0  ? Years of education: Not on file  ? Highest education level: Not on file  ?Occupational History  ?  Employer: [employer]  ?Tobacco Use  ? Smoking status: Never  ? Smokeless tobacco: Never  ?Vaping Use  ? Vaping Use: Never used  ?Substance and Sexual Activity  ? Alcohol use: Not Currently  ?  Alcohol/week: 0.0 - 1.0 standard drinks  ?  Comment: socially--2 drinks/month  ? Drug use: No  ? Sexual activity: Yes  ?  Partners: Male  ?  Birth control/protection: Post-menopausal  ?Other Topics Concern  ? Not on file  ?Social History Narrative  ? Lives with husband.  Works at [employer].  ? ?Social Determinants of Health  ? ?Financial Resource Strain: Not on file  ?Food Insecurity: Not on file  ?Transportation Needs: Not on file  ?Physical Activity: Not on file  ?Stress: Not on file  ?Social Connections: Not on file  ?Intimate Partner Violence: Not on file  ? ? ?Physical Exam: ?Vital signs in last 24 hours: ?_0  121/65   Pulse 70   Temp 98.4 ?F (36.9 ?C)   Ht _1  (1.626 m)   Wt 155 lb (70.3 kg)   LMP 05/24/2012 (Approximate)   SpO2 100%   BMI 26.61 kg/m?  ?GEN: NAD ?EYE: Sclerae anicteric ?ENT: MMM ?CV: Non-tachycardic ?Pulm: CTA b/l ?GI: Soft, NT/ND ?NEURO:  Alert & Oriented x 3 ? ? ?Zenovia Jarred, MD ?[facility] ? ?10/26/2021 1:25 PM ? ?

## 2021-10-26 NOTE — Patient Instructions (Signed)
YOU HAD AN ENDOSCOPIC PROCEDURE TODAY AT THE [facility]:   Refer to the procedure report that was given to you for any specific questions about what was found during the examination.  If the procedure report does not answer your questions, please call your gastroenterologist to clarify.  If you requested that your care partner not be given the details of your procedure findings, then the procedure report has been included in a sealed envelope for you to review at your convenience later.  YOU SHOULD EXPECT: Some feelings of bloating in the abdomen. Passage of more gas than usual.  Walking can help get rid of the air that was put into your GI tract during the procedure and reduce the bloating. If you had a lower endoscopy (such as a colonoscopy or flexible sigmoidoscopy) you may notice spotting of blood in your stool or on the toilet paper. If you underwent a bowel prep for your procedure, you may not have a normal bowel movement for a few days.  Please Note:  You might notice some irritation and congestion in your nose or some drainage.  This is from the oxygen used during your procedure.  There is no need for concern and it should clear up in a day or so.  SYMPTOMS TO REPORT IMMEDIATELY:   Following lower endoscopy (colonoscopy or flexible sigmoidoscopy):  Excessive amounts of blood in the stool  Significant tenderness or worsening of abdominal pains  Swelling of the abdomen that is new, acute  Fever of 100F or higher  For urgent or emergent issues, a gastroenterologist can be reached at any hour by calling [phone]. Do not use MyChart messaging for urgent concerns.    DIET:  We do recommend a small meal at first, but then you may proceed to your regular diet.  Drink plenty of fluids but you should avoid alcoholic beverages for 24 hours.  ACTIVITY:  You should plan to take it easy for the rest of today and you should NOT DRIVE or use heavy machinery until tomorrow (because  of the sedation medicines used during the test).    FOLLOW UP: Our staff will call the number listed on your records 48-72 hours following your procedure to check on you and address any questions or concerns that you may have regarding the information given to you following your procedure. If we do not reach you, we will leave a message.  We will attempt to reach you two times.  During this call, we will ask if you have developed any symptoms of COVID 19. If you develop any symptoms (ie: fever, flu-like symptoms, shortness of breath, cough etc.) before then, please call [phone].  If you test positive for Covid 19 in the 2 weeks post procedure, please call and report this information to us.    If any biopsies were taken you will be contacted by phone or by letter within the next 1-3 weeks.  Please call us at [phone] if you have not heard about the biopsies in 3 weeks.    SIGNATURES/CONFIDENTIALITY: You and/or your care partner have signed paperwork which will be entered into your electronic medical record.  These signatures attest to the fact that that the information above on your After Visit Summary has been reviewed and is understood.  Full responsibility of the confidentiality of this discharge information lies with you and/or your care-partner. 

## 2021-10-26 NOTE — Progress Notes (Signed)
VSS, transported to PACU °

## 2021-10-26 NOTE — Op Note (Signed)
[facility] ?Patient Name: Sydney Padilla ?Procedure Date: 10/26/2021 1:30 PM ?MRN: [id number] ?Endoscopist: Jerene Bears , MD ?Age: 61 ?Referring MD:  ?Date of Birth: 1960/11/16 ?Gender: Female ?Account #: [id number] ?Procedure:                Colonoscopy ?Indications:              High risk colon cancer surveillance: Personal  ?                          history of sessile serrated colon polyps x 4 (less  ?                          than 10 mm in size) with no dysplasia, Last  ?                          colonoscopy: January 2020 ?Medicines:                Monitored Anesthesia Care ?Procedure:                Pre-Anesthesia Assessment: ?                          - Prior to the procedure, a History and Physical  ?                          was performed, and patient medications and  ?                          allergies were reviewed. The patient's tolerance of  ?                          previous anesthesia was also reviewed. The risks  ?                          and benefits of the procedure and the sedation  ?                          options and risks were discussed with the patient.  ?                          All questions were answered, and informed consent  ?                          was obtained. Prior Anticoagulants: The patient has  ?                          taken no previous anticoagulant or antiplatelet  ?                          agents. ASA Grade Assessment: II - A patient with  ?                          mild systemic disease. After reviewing the risks  ?  and benefits, the patient was deemed in  ?                          satisfactory condition to undergo the procedure. ?                          After obtaining informed consent, the colonoscope  ?                          was passed under direct vision. Throughout the  ?                          procedure, the patient's blood pressure, pulse, and  ?                          oxygen saturations were monitored  continuously. The  ?                          Olympus PCF-H190DL (#[id number]) Colonoscope was  ?                          introduced through the anus and advanced to the  ?                          cecum, identified by appendiceal orifice and  ?                          ileocecal valve. The colonoscopy was performed  ?                          without difficulty. The patient tolerated the  ?                          procedure well. The quality of the bowel  ?                          preparation was good. The ileocecal valve,  ?                          appendiceal orifice, and rectum were photographed. ?Scope In: 1:35:32 PM ?Scope Out: 1:53:58 PM ?Scope Withdrawal Time: 0 hours 14 minutes 40 seconds  ?Total Procedure Duration: 0 hours 18 minutes 26 seconds  ?Findings:                 The digital rectal exam was normal. ?                          Two sessile polyps were found in the sigmoid colon.  ?                          The polyps were 4 to 5 mm in size. These polyps  ?                          were removed with a cold snare. Resection and  ?  retrieval were complete. ?                          Internal hemorrhoids were found during  ?                          retroflexion. The hemorrhoids were small. ?                          The exam was otherwise without abnormality. ?Complications:            No immediate complications. ?Estimated Blood Loss:     Estimated blood loss was minimal. ?Impression:               - Two 4 to 5 mm polyps in the sigmoid colon,  ?                          removed with a cold snare. Resected and retrieved. ?                          - Small internal hemorrhoids. ?                          - The examination was otherwise normal. ?Recommendation:           - Patient has a contact number available for  ?                          emergencies. The signs and symptoms of potential  ?                          delayed complications were discussed with the  ?                           patient. Return to normal activities tomorrow.  ?                          Written discharge instructions were provided to the  ?                          patient. ?                          - Resume previous diet. ?                          - Continue present medications. ?                          - Await pathology results. ?                          - Repeat colonoscopy in 5 years for surveillance. ?Jerene Bears, MD ?10/26/2021 1:56:36 PM ?This report has been signed electronically. ?

## 2021-10-26 NOTE — Progress Notes (Signed)
Pt's states no medical or surgical changes since previsit or office visit. 

## 2021-10-30 ENCOUNTER — Telehealth: Payer: Self-pay

## 2021-10-30 NOTE — Telephone Encounter (Signed)
?  Follow up Call- ? ? ?  10/26/2021  ?  1:00 PM  ?Call back number  ?Post procedure Call Back phone  # [phone]  ?Permission to leave phone message Yes  ?  ? ?Patient questions: ? ?Do you have a fever, pain , or abdominal swelling? No. ?Pain Score  0 * ? ?Have you tolerated food without any problems? Yes.   ? ?Have you been able to return to your normal activities? Yes.   ? ?Do you have any questions about your discharge instructions: ?Diet   No. ?Medications  No. ?Follow up visit  No. ? ?Do you have questions or concerns about your Care? No. ? ?Actions: ?* If pain score is 4 or above: ?No action needed, pain <4. ? ? ?

## 2021-10-31 ENCOUNTER — Encounter: Payer: Self-pay | Admitting: Internal Medicine

## 2021-11-26 ENCOUNTER — Ambulatory Visit
Admission: RE | Admit: 2021-11-26 | Discharge: 2021-11-26 | Disposition: A | Discharge status: Home / Self Care | Payer: 59 | Source: Ambulatory Visit | Attending: Nurse Practitioner | Admitting: Nurse Practitioner

## 2021-11-26 DIAGNOSIS — Z1231 Encounter for screening mammogram for malignant neoplasm of breast: Secondary | ICD-10-CM

## 2021-12-13 ENCOUNTER — Ambulatory Visit: Payer: 59 | Admitting: Obstetrics and Gynecology

## 2021-12-20 ENCOUNTER — Ambulatory Visit: Payer: 59 | Admitting: Obstetrics and Gynecology

## 2021-12-26 ENCOUNTER — Other Ambulatory Visit: Payer: Self-pay

## 2021-12-26 DIAGNOSIS — N951 Menopausal and female climacteric states: Secondary | ICD-10-CM

## 2021-12-26 MED ORDER — PROGESTERONE MICRONIZED 100 MG PO CAPS: 100.0000 mg | ORAL_CAPSULE | Freq: Every day | ORAL | 0 refills | Status: DC

## 2021-12-26 MED ORDER — ESTRADIOL 0.5 MG PO TABS: 0.5000 mg | ORAL_TABLET | Freq: Every day | ORAL | 0 refills | Status: DC

## 2021-12-26 NOTE — Telephone Encounter (Signed)
Last AEX 12/12/20-scheduled for 12/31/21. Last mammo 11/26/21-neg birads 1.

## 2021-12-31 ENCOUNTER — Encounter: Payer: Self-pay | Admitting: Obstetrics and Gynecology

## 2021-12-31 ENCOUNTER — Ambulatory Visit (INDEPENDENT_AMBULATORY_CARE_PROVIDER_SITE_OTHER): Payer: 59 | Admitting: Obstetrics and Gynecology

## 2021-12-31 VITALS — BP 130/62 | HR 72 | Ht 63.25 in | Wt 166.0 lb

## 2021-12-31 DIAGNOSIS — Z79890 Hormone replacement therapy (postmenopausal): Secondary | ICD-10-CM

## 2021-12-31 DIAGNOSIS — N951 Menopausal and female climacteric states: Secondary | ICD-10-CM

## 2021-12-31 DIAGNOSIS — G43009 Migraine without aura, not intractable, without status migrainosus: Secondary | ICD-10-CM

## 2021-12-31 DIAGNOSIS — Z01419 Encounter for gynecological examination (general) (routine) without abnormal findings: Secondary | ICD-10-CM

## 2021-12-31 MED ORDER — PROGESTERONE MICRONIZED 100 MG PO CAPS: 100.0000 mg | ORAL_CAPSULE | Freq: Every day | ORAL | 3 refills | Status: DC

## 2021-12-31 MED ORDER — ZOLMITRIPTAN 2.5 MG PO TBDP: ORAL_TABLET | ORAL | 2 refills | Status: DC

## 2021-12-31 MED ORDER — ESTRADIOL 0.5 MG PO TABS: 0.5000 mg | ORAL_TABLET | Freq: Every day | ORAL | 3 refills | Status: DC

## 2021-12-31 NOTE — Patient Instructions (Signed)
EXERCISE AND DIET:  We recommended that you start or continue a regular exercise program for good health. Regular exercise means any activity that makes your heart beat faster and makes you sweat.  We recommend exercising at least 30 minutes per day at least 3 days a week, preferably 4 or 5.  We also recommend a diet low in fat and sugar.  Inactivity, poor dietary choices and obesity can cause diabetes, heart attack, stroke, and kidney damage, among others.    ALCOHOL AND SMOKING:  Women should limit their alcohol intake to no more than 7 drinks/beers/glasses of wine (combined, not each!) per week. Moderation of alcohol intake to this level decreases your risk of breast cancer and liver damage. And of course, no recreational drugs are part of a healthy lifestyle.  And absolutely no smoking or even second hand smoke. Most people know smoking can cause heart and lung diseases, but did you know it also contributes to weakening of your bones? Aging of your skin?  Yellowing of your teeth and nails?  CALCIUM AND VITAMIN D:  Adequate intake of calcium and Vitamin D are recommended.  The recommendations for exact amounts of these supplements seem to change often, but generally speaking 600 mg of calcium (either carbonate or citrate) and 800 units of Vitamin D per day seems prudent. Certain women may benefit from higher intake of Vitamin D.  If you are among these women, your doctor will have told you during your visit.    PAP SMEARS:  Pap smears, to check for cervical cancer or precancers,  have traditionally been done yearly, although recent scientific advances have shown that most women can have pap smears less often.  However, every woman still should have a physical exam from her gynecologist every year. It will include a breast check, inspection of the vulva and vagina to check for abnormal growths or skin changes, a visual exam of the cervix, and then an exam to evaluate the size and shape of the uterus and  ovaries.  And after 60 years of age, a rectal exam is indicated to check for rectal cancers. We will also provide age appropriate advice regarding health maintenance, like when you should have certain vaccines, screening for sexually transmitted diseases, bone density testing, colonoscopy, mammograms, etc.   MAMMOGRAMS:  All women over 40 years old should have a yearly mammogram. Many facilities now offer a "3D" mammogram, which may cost around $50 extra out of pocket. If possible,  we recommend you accept the option to have the 3D mammogram performed.  It both reduces the number of women who will be called back for extra views which then turn out to be normal, and it is better than the routine mammogram at detecting truly abnormal areas.    COLONOSCOPY:  Colonoscopy to screen for colon cancer is recommended for all women at age 50.  We know, you hate the idea of the prep.  We agree, BUT, having colon cancer and not knowing it is worse!!  Colon cancer so often starts as a polyp that can be seen and removed at colonscopy, which can quite literally save your life!  And if your first colonoscopy is normal and you have no family history of colon cancer, most women don't have to have it again for 10 years.  Once every ten years, you can do something that may end up saving your life, right?  We will be happy to help you get it scheduled when you are ready.    Be sure to check your insurance coverage so you understand how much it will cost.  It may be covered as a preventative service at no cost, but you should check your particular policy.    Calcium Content in Foods Calcium is the most abundant mineral in the body. Most of the body's calcium supply is stored in bones and teeth. Calcium helps many parts of the body function normally, including: Blood and blood vessels. Nerves. Hormones. Muscles. Bones and teeth. When your calcium stores are low, you may be at risk for low bone mass, bone loss, and broken bones  (fractures). When you get enough calcium, it helps to support strong bones and teeth throughout your life. Calcium is especially important for: Children during growth spurts. Girls during adolescence. Women who are pregnant or breastfeeding. Women after their menstrual cycle stops (postmenopause). Women whose menstrual cycle has stopped due to anorexia nervosa or regular intense exercise. People who cannot eat or digest dairy products. Vegans. Recommended daily amounts of calcium: Women (ages 19 to 50): 1,000 mg per day. Women (ages 51 and older): 1,200 mg per day. Men (ages 19 to 70): 1,000 mg per day. Men (ages 71 and older): 1,200 mg per day. Women (ages 9 to 18): 1,300 mg per day. Men (ages 9 to 18): 1,300 mg per day. General information Eat foods that are high in calcium. Try to get most of your calcium from food. Some people may benefit from taking calcium supplements. Check with your health care provider or diet and nutrition specialist (dietitian) before starting any calcium supplements. Calcium supplements may interact with certain medicines. Too much calcium may cause other health problems, such as constipation and kidney stones. For the body to absorb calcium, it needs vitamin D. Sources of vitamin D include: Skin exposure to direct sunlight. Foods, such as egg yolks, liver, mushrooms, saltwater fish, and fortified milk. Vitamin D supplements. Check with your health care provider or dietitian before starting any vitamin D supplements. What foods are high in calcium?  Foods that are high in calcium contain more than 100 milligrams per serving. Fruits Fortified orange juice or other fruit juice, 300 mg per 8 oz serving. Vegetables Collard greens, 360 mg per 8 oz serving. Kale, 100 mg per 8 oz serving. Bok choy, 160 mg per 8 oz serving. Grains Fortified ready-to-eat cereals, 100 to 1,000 mg per 8 oz serving. Fortified frozen waffles, 200 mg in 2 waffles. Oatmeal, 140 mg in  1 cup. Meats and other proteins Sardines, canned with bones, 325 mg per 3 oz serving. Salmon, canned with bones, 180 mg per 3 oz serving. Canned shrimp, 125 mg per 3 oz serving. Baked beans, 160 mg per 4 oz serving. Tofu, firm, made with calcium sulfate, 253 mg per 4 oz serving. Dairy Yogurt, plain, low-fat, 310 mg per 6 oz serving. Nonfat milk, 300 mg per 8 oz serving. American cheese, 195 mg per 1 oz serving. Cheddar cheese, 205 mg per 1 oz serving. Cottage cheese 2%, 105 mg per 4 oz serving. Fortified soy, rice, or almond milk, 300 mg per 8 oz serving. Mozzarella, part skim, 210 mg per 1 oz serving. The items listed above may not be a complete list of foods high in calcium. Actual amounts of calcium may be different depending on processing. Contact a dietitian for more information. What foods are lower in calcium? Foods that are lower in calcium contain 50 mg or less per serving. Fruits Apple, about 6 mg. Banana, about 12 mg.   Vegetables Lettuce, 19 mg per 2 oz serving. Tomato, about 11 mg. Grains Rice, 4 mg per 6 oz serving. Boiled potatoes, 14 mg per 8 oz serving. White bread, 6 mg per slice. Meats and other proteins Egg, 27 mg per 2 oz serving. Red meat, 7 mg per 4 oz serving. Chicken, 17 mg per 4 oz serving. Fish, cod, or trout, 20 mg per 4 oz serving. Dairy Cream cheese, regular, 14 mg per 1 Tbsp serving. Brie cheese, 50 mg per 1 oz serving. Parmesan cheese, 70 mg per 1 Tbsp serving. The items listed above may not be a complete list of foods lower in calcium. Actual amounts of calcium may be different depending on processing. Contact a dietitian for more information. Summary Calcium is an important mineral in the body because it affects many functions. Getting enough calcium helps support strong bones and teeth throughout your life. Try to get most of your calcium from food. Calcium supplements may interact with certain medicines. Check with your health care provider  or dietitian before starting any calcium supplements. This information is not intended to replace advice given to you by your health care provider. Make sure you discuss any questions you have with your health care provider. Document Revised: 10/06/2019 Document Reviewed: 10/06/2019 Elsevier Patient Education  2023 Elsevier Inc.  

## 2021-12-31 NOTE — Progress Notes (Signed)
61 y.o. G96P0000 Married Caucasian female here for annual exam.    Had calcium scan and scored 3 out of 100 at cardiologist. FH of CAD.  Not taking her statin yet.   Takes HRT and likes it.   Uses Zomig prn.  Can use it a couple of times a month or go months without use of it.   Plans to retire from [employer] soon. Her mother is in long term care.   PCP:  Jacolyn Reedy, NP   Patient's last menstrual period was 05/24/2012 (approximate).           Sexually active: Yes.    The current method of family planning is post menopausal status.    Exercising: Yes.     Pilates, walking Smoker:  no  Health Maintenance: Pap:   12-02-18 Neg:Neg HR HPV, 09-01-15 Neg:Neg HR HPV, 07-01-12 Neg:Neg HR HPV History of abnormal Pap:  no MMG:  11-26-21 Neg/BiRads1 Colonoscopy:  10-26-21 polyps;next 5 years BMD: 2011  Result :Normal TDaP:  2018 Gardasil:   no HIV: 2018 NR Hep C: 2018 NR Screening Labs:  PCP   reports that she has never smoked. She has never used smokeless tobacco. She reports current alcohol use. She reports that she does not use drugs.  Past Medical History:  Diagnosis Date   Acute bilateral low back pain without sciatica 03/18/2018   Backache 04/24/2010   Qualifier: Diagnosis of  By: Sherren Mocha MD, Dellis Filbert A    Migraine without aura since college   more regualr with menses, now in menopause and less frequent   Seasonal allergies    Tachycardia 2000   occasional use of Atenol    Past Surgical History:  Procedure Laterality Date   COLONOSCOPY  2020   JMP-MAC-suprep(good)-TA   LAPAROSCOPIC APPENDECTOMY N/A 01/27/2017   Procedure: APPENDECTOMY LAPAROSCOPIC;  Surgeon: Coralie Keens, MD;  Location: [facility];  Service: General;  Laterality: N/A;   POLYPECTOMY  2020   TA   WISDOM TOOTH EXTRACTION  1982    Current Outpatient Medications  Medication Sig Dispense Refill   Ascorbic Acid (VITAMIN C PO) Take 1 tablet by mouth daily at 6 (six) AM.     aspirin 81 MG EC tablet Take 81 mg by mouth  daily.     B Complex Vitamins (VITAMIN B COMPLEX PO) Take 1 tablet by mouth daily at 6 (six) AM.     estradiol (ESTRACE) 0.5 MG tablet Take 1 tablet (0.5 mg total) by mouth daily. 30 tablet 0   progesterone (PROMETRIUM) 100 MG capsule Take 1 capsule (100 mg total) by mouth daily. 30 capsule 0   VITAMIN D PO Take 1 tablet by mouth daily at 6 (six) AM.     zolmitriptan (ZOMIG-ZMT) 2.5 MG disintegrating tablet TAKE ONE TABLET BY MOUTH ONCE A DAY AS NEEDED FOR HEADACHE 6 tablet 2   rosuvastatin (CRESTOR) 5 MG tablet Take 1 tablet (5 mg total) by mouth daily. (Patient not taking: Reported on 09/26/2021) 90 tablet 3   No current facility-administered medications for this visit.    Family History  Problem Relation Age of Onset   Colon polyps Mother 19   Obesity Mother    Stroke Mother    Heart disease Mother    Hyperlipidemia Mother    Atrial fibrillation Mother    Hypertension Mother    Osteoarthritis Mother    Alzheimer's disease Mother    Pulmonary embolism Mother    Hypertension Father    Hyperlipidemia Father  Heart disease Father        pace maker   Atrial fibrillation Father    Diabetes Maternal Grandmother    Hypertension Maternal Grandmother    Stroke Maternal Grandmother    Uterine cancer Maternal Grandmother    Heart disease Maternal Grandfather    Cancer Other        uterine   Breast cancer Neg Hx    Colon cancer Neg Hx    Hemochromatosis Neg Hx    Esophageal cancer Neg Hx    Rectal cancer Neg Hx    Stomach cancer Neg Hx     Review of Systems  All other systems reviewed and are negative.   Exam:   BP 130/62   Pulse 72   Ht 5' 3.25" (1.607 m)   Wt 166 lb (75.3 kg)   LMP 05/24/2012 (Approximate)   SpO2 97%   BMI 29.17 kg/m     General appearance: alert, cooperative and appears stated age Head: normocephalic, without obvious abnormality, atraumatic Neck: no adenopathy, supple, symmetrical, trachea midline and thyroid normal to inspection and  palpation Lungs: clear to auscultation bilaterally Breasts: normal appearance, no masses or tenderness, No nipple retraction or dimpling, No nipple discharge or bleeding, No axillary adenopathy Heart: regular rate and rhythm Abdomen: soft, non-tender; no masses, no organomegaly Extremities: extremities normal, atraumatic, no cyanosis or edema Skin: skin color, texture, turgor normal. No rashes or lesions Lymph nodes: cervical, supraclavicular, and axillary nodes normal. Neurologic: grossly normal  Pelvic: External genitalia:  no lesions              No abnormal inguinal nodes palpated.              Urethra:  normal appearing urethra with no masses, tenderness or lesions              Bartholins and Skenes: normal                 Vagina: normal appearing vagina with normal color and discharge, no lesions              Cervix: no lesions              Pap taken: no Bimanual Exam:  Uterus:  normal size, contour, position, consistency, mobility, non-tender              Adnexa: no mass, fullness, tenderness              Rectal exam: yes.  Confirms.              Anus:  normal sphincter tone, no lesions  Chaperone was present for exam:  Estill Bamberg, CMA  Assessment:   Well woman visit with gynecologic exam. HRT.  Estrace and Prometrium.  Migraine without aura.  Plan: Mammogram screening discussed. Self breast awareness reviewed. Pap and HR HPV as above. Guidelines for Calcium, Vitamin D, regular exercise program including cardiovascular and weight bearing exercise. Discused WHI and use of HRT which can increase risk of PE, DVT, MI, stroke and breast cancer.  Refill of Estrace and Prometrium.   Refill of Zomig.  Labs with PCP.  Follow up annually and prn.   After visit summary provided.

## 2022-01-10 ENCOUNTER — Other Ambulatory Visit (HOSPITAL_BASED_OUTPATIENT_CLINIC_OR_DEPARTMENT_OTHER): Payer: Self-pay | Admitting: Nurse Practitioner

## 2022-01-10 DIAGNOSIS — B372 Candidiasis of skin and nail: Secondary | ICD-10-CM

## 2022-01-10 MED ORDER — NYSTATIN-TRIAMCINOLONE 100000-0.1 UNIT/GM-% EX OINT: 1.0000 | TOPICAL_OINTMENT | Freq: Two times a day (BID) | CUTANEOUS | 3 refills | Status: DC

## 2022-01-10 MED ORDER — NYSTATIN 100000 UNIT/GM EX POWD: CUTANEOUS | 3 refills | Status: DC

## 2022-02-14 ENCOUNTER — Encounter (HOSPITAL_BASED_OUTPATIENT_CLINIC_OR_DEPARTMENT_OTHER): Payer: Self-pay

## 2022-02-14 ENCOUNTER — Ambulatory Visit (HOSPITAL_BASED_OUTPATIENT_CLINIC_OR_DEPARTMENT_OTHER): Payer: 59 | Admitting: Nurse Practitioner

## 2022-02-14 NOTE — Patient Instructions (Signed)
Patient came in for a ear irrigation only. Patient tolerated well.

## 2022-06-04 ENCOUNTER — Encounter: Payer: Self-pay | Admitting: Nurse Practitioner

## 2022-06-04 ENCOUNTER — Ambulatory Visit (INDEPENDENT_AMBULATORY_CARE_PROVIDER_SITE_OTHER): Payer: 59 | Admitting: Nurse Practitioner

## 2022-06-04 VITALS — BP 120/72 | HR 62 | Temp 98.1°F | Ht 65.5 in | Wt 170.0 lb

## 2022-06-04 DIAGNOSIS — C44319 Basal cell carcinoma of skin of other parts of face: Secondary | ICD-10-CM | POA: Insufficient documentation

## 2022-06-04 DIAGNOSIS — Z0000 Encounter for general adult medical examination without abnormal findings: Secondary | ICD-10-CM

## 2022-06-04 DIAGNOSIS — Z636 Dependent relative needing care at home: Secondary | ICD-10-CM | POA: Diagnosis not present

## 2022-06-04 NOTE — Patient Instructions (Addendum)
I recommend adding benefiber to her drinks daily and add 1tsp to 1tbsp of miralax to her drink every evening to see if we can get her bowels moving a little better.   I will let you know what your labs show and if we need to make any changes I will let you know.

## 2022-06-04 NOTE — Progress Notes (Signed)
[name], DNP, AGNP-c [address] [place], [place] [address] Main Office [phone]  BP 120/72   Pulse 62   Temp 98.1 F (36.7 C)   Ht 5' 5.5" (1.664 m)   Wt 170 lb (77.1 kg)   LMP 05/24/2012 (Approximate)   BMI 27.86 kg/m    Subjective:    Patient ID: Sydney Padilla, female    DOB: [birth year], 61 y.o.   MRN: [id number]  HPI: Sydney Padilla is a 61 y.o. female presenting on 06/04/2022 for comprehensive medical examination.   Current medical concerns include: Seeing a chiropractor and massage therapist for psoas and low back pain from lifting her mom. She denies paresthesias.   She is in need of the newest COVID booster She reports regular vision exams q1-5y: Yes  She reports regular dental exams q 60m  Yes  The patient eats a regular, healthy diet. She endorses exercise and/or activity of:  pilates, stretching  She endorses the following: Marital Status: married Living situation: with spouse  She denies concerns with STI today, testing was not ordered  A comprehensive review of systems was negative.  Most Recent Depression Screen:     06/04/2022    9:09 AM 04/03/2021    3:03 PM 01/27/2017   10:16 AM  Depression screen PHQ 2/9  Decreased Interest 0 0 0  Down, Depressed, Hopeless 0 0 0  PHQ - 2 Score 0 0 0   Most Recent Anxiety Screen:      No data to display         Most Recent Fall Screen:    06/04/2022    9:09 AM 04/03/2021    3:01 PM  [place]in the past year? 0 0  Number falls in past yr: 0 0  Injury with Fall? 0 0  Risk for fall due to : No Fall Risks No Fall Risks  Follow up Falls evaluation completed Falls evaluation completed;Falls prevention discussed    Past medical history, surgical history, medications, allergies, family history and social history reviewed with patient today and changes made to appropriate areas of the chart.  Past Medical History:  Past Medical History:   Diagnosis Date   Acute bilateral low back pain without sciatica 03/18/2018   Backache 04/24/2010   Qualifier: Diagnosis of  By: TSherren MochaMD, JDellis FilbertA    Migraine without aura since college   more regualr with menses, now in menopause and less frequent   Seasonal allergies    Tachycardia 2000   occasional use of Atenol   Medications:  Current Outpatient Medications on File Prior to Visit  Medication Sig   Ascorbic Acid (VITAMIN C PO) Take 1 tablet by mouth daily at 6 (six) AM.   B Complex Vitamins (VITAMIN B COMPLEX PO) Take 1 tablet by mouth daily at 6 (six) AM.   estradiol (ESTRACE) 0.5 MG tablet Take 1 tablet (0.5 mg total) by mouth daily.   nystatin (MYCOSTATIN/NYSTOP) powder Apply to groin with each bathroom use   nystatin-triamcinolone ointment (MYCOLOG) Apply 1 Application topically 2 (two) times daily. Apply to thighs and rash twice a day for at least 7 days   progesterone (PROMETRIUM) 100 MG capsule Take 1 capsule (100 mg total) by mouth daily.   rosuvastatin (CRESTOR) 5 MG tablet Take 1 tablet (5 mg total) by mouth daily.   VITAMIN D PO Take 1 tablet by mouth daily at 6 (six) AM.   zolmitriptan (ZOMIG-ZMT) 2.5 MG disintegrating tablet  TAKE ONE TABLET BY MOUTH ONCE A DAY AS NEEDED FOR HEADACHE (Patient not taking: Reported on 06/04/2022)   No current facility-administered medications on file prior to visit.   Surgical History:  Past Surgical History:  Procedure Laterality Date   COLONOSCOPY  2020   JMP-MAC-suprep(good)-TA   LAPAROSCOPIC APPENDECTOMY N/A 01/27/2017   Procedure: APPENDECTOMY LAPAROSCOPIC;  Surgeon: Coralie Keens, MD;  Location: [facility] OR;  Service: General;  Laterality: N/A;   POLYPECTOMY  2020   TA   WISDOM TOOTH EXTRACTION  1982   Allergies:  No Known Allergies Family History:  Family History  Problem Relation Age of Onset   Colon polyps Mother 45   Obesity Mother    Stroke Mother    Heart disease Mother    Hyperlipidemia Mother    Atrial  fibrillation Mother    Hypertension Mother    Osteoarthritis Mother    Alzheimer's disease Mother    Pulmonary embolism Mother    Hypertension Father    Hyperlipidemia Father    Heart disease Father        pace maker   Atrial fibrillation Father    Diabetes Maternal Grandmother    Hypertension Maternal Grandmother    Stroke Maternal Grandmother    Uterine cancer Maternal Grandmother    Heart disease Maternal Grandfather    Cancer Other        uterine   Breast cancer Neg Hx    Colon cancer Neg Hx    Hemochromatosis Neg Hx    Esophageal cancer Neg Hx    Rectal cancer Neg Hx    Stomach cancer Neg Hx        Objective:    BP 120/72   Pulse 62   Temp 98.1 F (36.7 C)   Ht 5' 5.5" (1.664 m)   Wt 170 lb (77.1 kg)   LMP 05/24/2012 (Approximate)   BMI 27.86 kg/m   Wt Readings from Last 3 Encounters:  06/04/22 170 lb (77.1 kg)  02/14/22 155 lb (70.3 kg)  12/31/21 166 lb (75.3 kg)    Physical Exam Vitals and nursing note reviewed.  Constitutional:      General: She is not in acute distress.    Appearance: Normal appearance.  HENT:     Head: Normocephalic and atraumatic.     Right Ear: Hearing, tympanic membrane, ear canal and external ear normal.     Left Ear: Hearing, tympanic membrane, ear canal and external ear normal.     Nose: Nose normal.     Right Sinus: No maxillary sinus tenderness or frontal sinus tenderness.     Left Sinus: No maxillary sinus tenderness or frontal sinus tenderness.     Mouth/Throat:     Lips: Pink.     Mouth: Mucous membranes are moist.     Pharynx: Oropharynx is clear.  Eyes:     General: Lids are normal. Vision grossly intact.     Extraocular Movements: Extraocular movements intact.     Conjunctiva/sclera: Conjunctivae normal.     Pupils: Pupils are equal, round, and reactive to light.     Funduscopic exam:    Right eye: Red reflex present.        Left eye: Red reflex present.    Visual Fields: Right eye visual fields normal and left  eye visual fields normal.  Neck:     Thyroid: No thyromegaly.     Vascular: No carotid bruit.  Cardiovascular:     Rate and Rhythm: Normal rate and regular  rhythm.     Chest Wall: PMI is not displaced.     Pulses: Normal pulses.          Dorsalis pedis pulses are 2+ on the right side and 2+ on the left side.       Posterior tibial pulses are 2+ on the right side and 2+ on the left side.     Heart sounds: Normal heart sounds. No murmur heard. Pulmonary:     Effort: Pulmonary effort is normal. No respiratory distress.     Breath sounds: Normal breath sounds.  Abdominal:     General: Abdomen is flat. Bowel sounds are normal. There is no distension.     Palpations: Abdomen is soft. There is no hepatomegaly, splenomegaly or mass.     Tenderness: There is no abdominal tenderness. There is no right CVA tenderness, left CVA tenderness, guarding or rebound.  Musculoskeletal:        General: Normal range of motion.     Cervical back: Full passive range of motion without pain, normal range of motion and neck supple. No tenderness.     Right lower leg: No edema.     Left lower leg: No edema.  Feet:     Left foot:     Toenail Condition: Left toenails are normal.  Lymphadenopathy:     Cervical: No cervical adenopathy.     Upper Body:     Right upper body: No supraclavicular adenopathy.     Left upper body: No supraclavicular adenopathy.  Skin:    General: Skin is warm and dry.     Capillary Refill: Capillary refill takes less than 2 seconds.     Nails: There is no clubbing.  Neurological:     General: No focal deficit present.     Mental Status: She is alert and oriented to person, place, and time.     GCS: GCS eye subscore is 4. GCS verbal subscore is 5. GCS motor subscore is 6.     Sensory: Sensation is intact.     Motor: Motor function is intact.     Coordination: Coordination is intact.     Gait: Gait is intact.     Deep Tendon Reflexes: Reflexes are normal and symmetric.   Psychiatric:        Attention and Perception: Attention normal.        Mood and Affect: Mood normal.        Speech: Speech normal.        Behavior: Behavior normal. Behavior is cooperative.        Thought Content: Thought content normal.        Cognition and Memory: Cognition and memory normal.        Judgment: Judgment normal.     Results for orders placed or performed in visit on 06/04/22  CBC with Differential/Platelet  Result Value Ref Range   WBC 4.9 3.4 - 10.8 x10E3/uL   RBC 4.37 3.77 - 5.28 x10E6/uL   Hemoglobin 13.3 11.1 - 15.9 g/dL   Hematocrit 40.8 34.0 - 46.6 %   MCV 93 79 - 97 fL   MCH 30.4 26.6 - 33.0 pg   MCHC 32.6 31.5 - 35.7 g/dL   RDW 12.6 11.7 - 15.4 %   Platelets 231 150 - 450 x10E3/uL   Neutrophils 50 Not Estab. %   Lymphs 36 Not Estab. %   Monocytes 9 Not Estab. %   Eos 4 Not Estab. %   Basos 1 Not Estab. %  Neutrophils Absolute 2.5 1.4 - 7.0 x10E3/uL   Lymphocytes Absolute 1.8 0.7 - 3.1 x10E3/uL   Monocytes Absolute 0.4 0.1 - 0.9 x10E3/uL   EOS (ABSOLUTE) 0.2 0.0 - 0.4 x10E3/uL   Basophils Absolute 0.0 0.0 - 0.2 x10E3/uL   Immature Granulocytes 0 Not Estab. %   Immature Grans (Abs) 0.0 0.0 - 0.1 x10E3/uL  Comprehensive metabolic panel  Result Value Ref Range   Glucose 89 70 - 99 mg/dL   BUN 13 8 - 27 mg/dL   Creatinine, Ser 0.80 0.57 - 1.00 mg/dL   eGFR 84 >59 mL/min/1.73   BUN/Creatinine Ratio 16 12 - 28   Sodium 139 134 - 144 mmol/L   Potassium 4.9 3.5 - 5.2 mmol/L   Chloride 103 96 - 106 mmol/L   CO2 24 20 - 29 mmol/L   Calcium 9.6 8.7 - 10.3 mg/dL   Total Protein 7.0 6.0 - 8.5 g/dL   Albumin 4.9 3.8 - 4.9 g/dL   Globulin, Total 2.1 1.5 - 4.5 g/dL   Albumin/Globulin Ratio 2.3 (H) 1.2 - 2.2   Bilirubin Total 0.3 0.0 - 1.2 mg/dL   Alkaline Phosphatase 68 44 - 121 IU/L   AST 13 0 - 40 IU/L   ALT 16 0 - 32 IU/L  Hemoglobin A1c  Result Value Ref Range   Hgb A1c MFr Bld 5.4 4.8 - 5.6 %   Est. average glucose Bld gHb Est-mCnc 108 mg/dL   Lipid panel  Result Value Ref Range   Cholesterol, Total 220 (H) 100 - 199 mg/dL   Triglycerides 76 0 - 149 mg/dL   HDL 97 >39 mg/dL   VLDL Cholesterol Cal 13 5 - 40 mg/dL   LDL Chol Calc (NIH) 110 (H) 0 - 99 mg/dL   Chol/HDL Ratio 2.3 0.0 - 4.4 ratio  TSH  Result Value Ref Range   TSH 1.210 0.450 - 4.500 uIU/mL  T4, free  Result Value Ref Range   Free T4 1.15 0.82 - 1.77 ng/dL  VITAMIN D 25 Hydroxy (Vit-D Deficiency, Fractures)  Result Value Ref Range   Vit D, 25-Hydroxy 30.1 30.0 - 100.0 ng/mL    IMMUNIZATIONS:   Flu: Flu vaccine declined, patient does not wish to complete Prevnar 13: Prevnar 13 N/A for this patient Pneumovax 23: Pneumovax 23 N/A for this patient Vac Shingrix: Shingrix due, prescription provided HPV: HPV N/A for this patient Tetanus: Tetanus completed in the last 10 years  HEALTH MAINTENANCE: Pap Smear HM Status: is up to date Mammogram HM Status: is up to date Colon Cancer Screening HM Status: is up to date Bone Density HM Status: is not applicable for this patient STI Testing HM Status: is not applicable for this patient  Eye Exam HM Status: is up to date Urine Micro HM Status: N/A Spirometry HM Status: is not applicable for this patient      Assessment & Plan:   Problem List Items Addressed This Visit     Encounter for annual physical exam - Primary    CPE today with no abnormalities noted on exam.  Labs pending. Will make changes as necessary based on results.  Review of HM activities and recommendations discussed and provided on AVS Anticipatory guidance, diet, and exercise recommendations provided.  Medications, allergies, and hx reviewed and updated as necessary.  Plan to f/u with CPE in 1 year or sooner for acute/chronic health needs as directed.        Relevant Orders   CBC with Differential/Platelet (Completed)  Comprehensive metabolic panel (Completed)   Hemoglobin A1c (Completed)   Lipid panel (Completed)   TSH (Completed)    T4, free (Completed)   VITAMIN D 25 Hydroxy (Vit-D Deficiency, Fractures) (Completed)   Pfizer Fall 2023 Covid-19 Vaccine 52yr and older (Completed)   Caregiver stress    JCristalleis doing quite well now that she has assistance with her mother's care. No alarm sx are present at this time. We will continue to monitor. I do recommend taking time for herself and allowing herself to have hobbies and activities outside of caring for her mother to help reduce this stress. We will continue to monitor.       Other Visit Diagnoses     Health care maintenance       Relevant Orders   CBC with Differential/Platelet (Completed)   Comprehensive metabolic panel (Completed)   Hemoglobin A1c (Completed)   Lipid panel (Completed)   TSH (Completed)   T4, free (Completed)   VITAMIN D 25 Hydroxy (Vit-D Deficiency, Fractures) (Completed)          Follow up plan: Return in about 1 year (around 06/05/2023) for CPE.  NEXT PREVENTATIVE PHYSICAL DUE IN 1 YEAR.  PATIENT COUNSELING PROVIDED FOR ALL ADULT PATIENTS:  Consume a well balanced diet low in saturated fats, cholesterol, and moderation in carbohydrates.   This can be as simple as monitoring portion sizes and cutting back on sugary beverages such as soda and juice to start with.    Daily water consumption of at least 64 ounces.  Physical activity at least 180 minutes per week, if just starting out.   This can be as simple as taking the stairs instead of the elevator and walking 2-3 laps around the office  purposefully every day.   STD protection, partner selection, and regular testing if high risk.  Limited consumption of alcoholic beverages if alcohol is consumed.  For women, I recommend no more than 7 alcoholic beverages per week, spread out throughout the week.  Avoid "binge" drinking or consuming large quantities of alcohol in one setting.   Please let me know if you feel you may need help with reduction or quitting alcohol consumption.    Avoidance of nicotine, if used.  Please let me know if you feel you may need help with reduction or quitting nicotine use.   Daily mental health attention.  This can be in the form of 5 minute daily meditation, prayer, journaling, yoga, reflection, etc.   Purposeful attention to your emotions and mental state can significantly improve your overall wellbeing and Health.  Please know that I am here to help you with all of your health care goals and am happy to work with you to find a solution that works best for you.  The greatest advice I have received with any changes in life are to take it one step at a time, that even means if all you can focus on is the next 60 seconds, then do that and celebrate your victories.  With any changes in life, you will have set backs, and that is OK. The important thing to remember is, if you have a set back, it is not a failure, it is an opportunity to try again!  Health Maintenance Recommendations Screening Testing Mammogram Every 1 -2 years based on history and risk factors Starting at age 7567Pap Smear Ages 21-39 every 3 years Ages 38-65every 5 years with HPV testing More frequent testing may be required based on results  and history Colon Cancer Screening Every 1-10 years based on test performed, risk factors, and history Starting at age 64 Bone Density Screening Every 2-10 years based on history Starting at age 52 for women Recommendations for men differ based on medication usage, history, and risk factors AAA Screening One time ultrasound Men 61-4 years old who have every smoked Lung Cancer Screening Low Dose Lung CT every 12 months Age 30-80 years with a 30 pack-year smoking history who still smoke or who have quit within the last 15 years  Screening Labs Routine  Labs: Complete Blood Count (CBC), Complete Metabolic Panel (CMP), Cholesterol (Lipid Panel) Every 6-12 months based on history and medications May be recommended more  frequently based on current conditions or previous results Hemoglobin A1c Lab Every 3-12 months based on history and previous results Starting at age 79 or earlier with diagnosis of diabetes, high cholesterol, BMI >26, and/or risk factors Frequent monitoring for patients with diabetes to ensure blood sugar control Thyroid Panel (TSH w/ T3 & T4) Every 6 months based on history, symptoms, and risk factors May be repeated more often if on medication HIV One time testing for all patients 35 and older May be repeated more frequently for patients with increased risk factors or exposure Hepatitis C One time testing for all patients 53 and older May be repeated more frequently for patients with increased risk factors or exposure Gonorrhea, Chlamydia Every 12 months for all sexually active persons 13-24 years Additional monitoring may be recommended for those who are considered high risk or who have symptoms PSA Men 61-50 years old with risk factors Additional screening may be recommended from age 58-69 based on risk factors, symptoms, and history  Vaccine Recommendations Tetanus Booster All adults every 10 years Flu Vaccine All patients 6 months and older every year COVID Vaccine All patients 12 years and older Initial dosing with booster May recommend additional booster based on age and health history HPV Vaccine 2 doses all patients age 53-26 Dosing may be considered for patients over 26 Shingles Vaccine (Shingrix) 2 doses all adults 62 years and older Pneumonia (Pneumovax 23) All adults 92 years and older May recommend earlier dosing based on health history Pneumonia (Prevnar 3) All adults 34 years and older Dosed 1 year after Pneumovax 23  Additional Screening, Testing, and Vaccinations may be recommended on an individualized basis based on family history, health history, risk factors, and/or exposure.

## 2022-06-05 LAB — T4, FREE: Free T4: 1.15 ng/dL (ref 0.82–1.77)

## 2022-06-05 LAB — CBC WITH DIFFERENTIAL/PLATELET
Basophils Absolute: 0 10*3/uL (ref 0.0–0.2)
Basos: 1 %
EOS (ABSOLUTE): 0.2 10*3/uL (ref 0.0–0.4)
Eos: 4 %
Hematocrit: 40.8 % (ref 34.0–46.6)
Hemoglobin: 13.3 g/dL (ref 11.1–15.9)
Immature Grans (Abs): 0 10*3/uL (ref 0.0–0.1)
Immature Granulocytes: 0 %
Lymphocytes Absolute: 1.8 10*3/uL (ref 0.7–3.1)
Lymphs: 36 %
MCH: 30.4 pg (ref 26.6–33.0)
MCHC: 32.6 g/dL (ref 31.5–35.7)
MCV: 93 fL (ref 79–97)
Monocytes Absolute: 0.4 10*3/uL (ref 0.1–0.9)
Monocytes: 9 %
Neutrophils Absolute: 2.5 10*3/uL (ref 1.4–7.0)
Neutrophils: 50 %
Platelets: 231 10*3/uL (ref 150–450)
RBC: 4.37 x10E6/uL (ref 3.77–5.28)
RDW: 12.6 % (ref 11.7–15.4)
WBC: 4.9 10*3/uL (ref 3.4–10.8)

## 2022-06-05 LAB — COMPREHENSIVE METABOLIC PANEL
ALT: 16 IU/L (ref 0–32)
AST: 13 IU/L (ref 0–40)
Albumin/Globulin Ratio: 2.3 — ABNORMAL HIGH (ref 1.2–2.2)
Albumin: 4.9 g/dL (ref 3.8–4.9)
Alkaline Phosphatase: 68 IU/L (ref 44–121)
BUN/Creatinine Ratio: 16 (ref 12–28)
BUN: 13 mg/dL (ref 8–27)
Bilirubin Total: 0.3 mg/dL (ref 0.0–1.2)
CO2: 24 mmol/L (ref 20–29)
Calcium: 9.6 mg/dL (ref 8.7–10.3)
Chloride: 103 mmol/L (ref 96–106)
Creatinine, Ser: 0.8 mg/dL (ref 0.57–1.00)
Globulin, Total: 2.1 g/dL (ref 1.5–4.5)
Glucose: 89 mg/dL (ref 70–99)
Potassium: 4.9 mmol/L (ref 3.5–5.2)
Sodium: 139 mmol/L (ref 134–144)
Total Protein: 7 g/dL (ref 6.0–8.5)
eGFR: 84 mL/min/{1.73_m2} (ref >59)

## 2022-06-05 LAB — TSH: TSH: 1.21 u[IU]/mL (ref 0.450–4.500)

## 2022-06-05 LAB — LIPID PANEL
Chol/HDL Ratio: 2.3 ratio (ref 0.0–4.4)
Cholesterol, Total: 220 mg/dL — ABNORMAL HIGH (ref 100–199)
HDL: 97 mg/dL (ref >39)
LDL Chol Calc (NIH): 110 mg/dL — ABNORMAL HIGH (ref 0–99)
Triglycerides: 76 mg/dL (ref 0–149)
VLDL Cholesterol Cal: 13 mg/dL (ref 5–40)

## 2022-06-05 LAB — VITAMIN D 25 HYDROXY (VIT D DEFICIENCY, FRACTURES): Vit D, 25-Hydroxy: 30.1 ng/mL (ref 30.0–100.0)

## 2022-06-05 LAB — HEMOGLOBIN A1C
Est. average glucose Bld gHb Est-mCnc: 108 mg/dL
Hgb A1c MFr Bld: 5.4 % (ref 4.8–5.6)

## 2022-06-07 NOTE — Assessment & Plan Note (Signed)

## 2022-06-07 NOTE — Assessment & Plan Note (Signed)
Sydney Padilla is doing quite well now that she has assistance with her mother's care. No alarm sx are present at this time. We will continue to monitor. I do recommend taking time for herself and allowing herself to have hobbies and activities outside of caring for her mother to help reduce this stress. We will continue to monitor.

## 2022-10-29 ENCOUNTER — Other Ambulatory Visit: Payer: Self-pay | Admitting: Nurse Practitioner

## 2022-10-29 DIAGNOSIS — Z0000 Encounter for general adult medical examination without abnormal findings: Secondary | ICD-10-CM

## 2022-11-28 ENCOUNTER — Ambulatory Visit
Admission: RE | Admit: 2022-11-28 | Discharge: 2022-11-28 | Disposition: A | Discharge status: Home / Self Care | Payer: 59 | Source: Ambulatory Visit | Attending: Nurse Practitioner | Admitting: Nurse Practitioner

## 2022-11-28 DIAGNOSIS — Z0000 Encounter for general adult medical examination without abnormal findings: Secondary | ICD-10-CM

## 2022-12-03 ENCOUNTER — Other Ambulatory Visit: Payer: Self-pay | Admitting: Nurse Practitioner

## 2022-12-03 DIAGNOSIS — R928 Other abnormal and inconclusive findings on diagnostic imaging of breast: Secondary | ICD-10-CM

## 2022-12-13 ENCOUNTER — Ambulatory Visit
Admission: RE | Admit: 2022-12-13 | Discharge: 2022-12-13 | Disposition: A | Discharge status: Home / Self Care | Payer: 59 | Source: Ambulatory Visit | Attending: Nurse Practitioner | Admitting: Nurse Practitioner

## 2022-12-13 ENCOUNTER — Other Ambulatory Visit: Payer: Self-pay | Admitting: Nurse Practitioner

## 2022-12-13 DIAGNOSIS — R928 Other abnormal and inconclusive findings on diagnostic imaging of breast: Secondary | ICD-10-CM

## 2022-12-13 DIAGNOSIS — N6489 Other specified disorders of breast: Secondary | ICD-10-CM

## 2022-12-19 ENCOUNTER — Other Ambulatory Visit: Payer: Self-pay | Admitting: Nurse Practitioner

## 2022-12-19 ENCOUNTER — Encounter: Payer: Self-pay | Admitting: Nurse Practitioner

## 2022-12-19 ENCOUNTER — Other Ambulatory Visit: Payer: Self-pay | Admitting: Obstetrics and Gynecology

## 2022-12-19 ENCOUNTER — Telehealth: Payer: Self-pay | Admitting: *Deleted

## 2022-12-19 DIAGNOSIS — F064 Anxiety disorder due to known physiological condition: Secondary | ICD-10-CM

## 2022-12-19 MED ORDER — LORAZEPAM 1 MG PO TABS: ORAL_TABLET | ORAL | 0 refills | Status: DC

## 2022-12-19 NOTE — Telephone Encounter (Signed)
Romualdo Bolk, MD at 12/19/2022 11:02 AM  Status: Sign when Signing Visit  Please let the patient know that I sent in ativan for her to take an hour prior to the procedure. She will need a driver.       Call placed to patient. Left detailed message, ok per dpr. Advised per Dr. Oscar La. Rx sent to [facility]. Return call to office if any additional questions.   Encounter closed.

## 2022-12-19 NOTE — Progress Notes (Signed)
Please let the patient know that I sent in ativan for her to take an hour prior to the procedure. She will need a driver.

## 2022-12-19 NOTE — Telephone Encounter (Signed)
Patient left voicemail on triage line requesting RX for anxiety prior to left breast biopsy scheduled on 12/20/22 at [facility].   Last AEX 12/31/21 -BS   Dr. Oscar La -please advise on RX.

## 2022-12-20 ENCOUNTER — Ambulatory Visit
Admission: RE | Admit: 2022-12-20 | Discharge: 2022-12-20 | Disposition: A | Discharge status: Home / Self Care | Payer: 59 | Source: Ambulatory Visit | Attending: Nurse Practitioner | Admitting: Nurse Practitioner

## 2022-12-20 ENCOUNTER — Other Ambulatory Visit: Payer: Self-pay | Admitting: Nurse Practitioner

## 2022-12-20 DIAGNOSIS — N6489 Other specified disorders of breast: Secondary | ICD-10-CM

## 2022-12-20 DIAGNOSIS — R928 Other abnormal and inconclusive findings on diagnostic imaging of breast: Secondary | ICD-10-CM

## 2022-12-30 NOTE — Progress Notes (Deleted)
62 y.o. G42P0000 Married Caucasian female here for annual exam.    PCP:     Patient's last menstrual period was 05/24/2012 (approximate).           Sexually active: {yes no:314532}  The current method of family planning is post menopausal status.    Exercising: {yes no:314532}  {types:19826} Smoker:  no  Health Maintenance: Pap:  12/02/18 neg: HR HPV neg History of abnormal Pap:  no MMG:  12/13/22 Breast Density Cat C, BI-RADS CAT 3 probably benign Colonoscopy:  10/26/21 BMD:   05/15/10  Result  normal TDaP:  01/27/17 Gardasil:   no HIV: n/a Hep C: n/a Screening Labs:  Hb today: ***, Urine today: ***   reports that she has never smoked. She has never used smokeless tobacco. She reports current alcohol use. She reports that she does not use drugs.  Past Medical History:  Diagnosis Date   Acute bilateral low back pain without sciatica 03/18/2018   Backache 04/24/2010   Qualifier: Diagnosis of  By: Tawanna Cooler MD, Tinnie Gens A    Migraine without aura since college   more regualr with menses, now in menopause and less frequent   Seasonal allergies    Tachycardia 2000   occasional use of Atenol    Past Surgical History:  Procedure Laterality Date   COLONOSCOPY  2020   JMP-MAC-suprep(good)-TA   LAPAROSCOPIC APPENDECTOMY N/A 01/27/2017   Procedure: APPENDECTOMY LAPAROSCOPIC;  Surgeon: Abigail Miyamoto, MD;  Location: [facility] OR;  Service: General;  Laterality: N/A;   POLYPECTOMY  2020   TA   WISDOM TOOTH EXTRACTION  1982    Current Outpatient Medications  Medication Sig Dispense Refill   Ascorbic Acid (VITAMIN C PO) Take 1 tablet by mouth daily at 6 (six) AM.     B Complex Vitamins (VITAMIN B COMPLEX PO) Take 1 tablet by mouth daily at 6 (six) AM.     estradiol (ESTRACE) 0.5 MG tablet Take 1 tablet (0.5 mg total) by mouth daily. 90 tablet 3   LORazepam (ATIVAN) 1 MG tablet Take one tablet po one hour prior to the procedure. 1 tablet 0   nystatin (MYCOSTATIN/NYSTOP) powder Apply to groin  with each bathroom use 60 g 3   nystatin-triamcinolone ointment (MYCOLOG) Apply 1 Application topically 2 (two) times daily. Apply to thighs and rash twice a day for at least 7 days 60 g 3   progesterone (PROMETRIUM) 100 MG capsule Take 1 capsule (100 mg total) by mouth daily. 90 capsule 3   rosuvastatin (CRESTOR) 5 MG tablet Take 1 tablet (5 mg total) by mouth daily. 90 tablet 3   VITAMIN D PO Take 1 tablet by mouth daily at 6 (six) AM.     zolmitriptan (ZOMIG-ZMT) 2.5 MG disintegrating tablet TAKE ONE TABLET BY MOUTH ONCE A DAY AS NEEDED FOR HEADACHE (Patient not taking: Reported on 06/04/2022) 6 tablet 2   No current facility-administered medications for this visit.    Family History  Problem Relation Age of Onset   Colon polyps Mother 64   Obesity Mother    Stroke Mother    Heart disease Mother    Hyperlipidemia Mother    Atrial fibrillation Mother    Hypertension Mother    Osteoarthritis Mother    Alzheimer's disease Mother    Pulmonary embolism Mother    Hypertension Father    Hyperlipidemia Father    Heart disease Father        pace maker   Atrial fibrillation Father  Diabetes Maternal Grandmother    Hypertension Maternal Grandmother    Stroke Maternal Grandmother    Uterine cancer Maternal Grandmother    Heart disease Maternal Grandfather    Cancer Other        uterine   Breast cancer Neg Hx    Colon cancer Neg Hx    Hemochromatosis Neg Hx    Esophageal cancer Neg Hx    Rectal cancer Neg Hx    Stomach cancer Neg Hx     Review of Systems  Exam:   LMP 05/24/2012 (Approximate)     General appearance: alert, cooperative and appears stated age Head: normocephalic, without obvious abnormality, atraumatic Neck: no adenopathy, supple, symmetrical, trachea midline and thyroid normal to inspection and palpation Lungs: clear to auscultation bilaterally Breasts: normal appearance, no masses or tenderness, No nipple retraction or dimpling, No nipple discharge or  bleeding, No axillary adenopathy Heart: regular rate and rhythm Abdomen: soft, non-tender; no masses, no organomegaly Extremities: extremities normal, atraumatic, no cyanosis or edema Skin: skin color, texture, turgor normal. No rashes or lesions Lymph nodes: cervical, supraclavicular, and axillary nodes normal. Neurologic: grossly normal  Pelvic: External genitalia:  no lesions              No abnormal inguinal nodes palpated.              Urethra:  normal appearing urethra with no masses, tenderness or lesions              Bartholins and Skenes: normal                 Vagina: normal appearing vagina with normal color and discharge, no lesions              Cervix: no lesions              Pap taken: {yes no:314532} Bimanual Exam:  Uterus:  normal size, contour, position, consistency, mobility, non-tender              Adnexa: no mass, fullness, tenderness              Rectal exam: {yes no:314532}.  Confirms.              Anus:  normal sphincter tone, no lesions  Chaperone was present for exam:  ***  Assessment:   Well woman visit with gynecologic exam.   Plan: Mammogram screening discussed. Self breast awareness reviewed. Pap and HR HPV as above. Guidelines for Calcium, Vitamin D, regular exercise program including cardiovascular and weight bearing exercise.   Follow up annually and prn.   Additional counseling given.  {yes [id number]. _______ minutes face to face time of which over 50% was spent in counseling.    After visit summary provided.

## 2023-01-02 ENCOUNTER — Ambulatory Visit: Payer: 59 | Admitting: Obstetrics and Gynecology

## 2023-01-06 ENCOUNTER — Ambulatory Visit: Payer: 59 | Admitting: Obstetrics and Gynecology

## 2023-01-27 ENCOUNTER — Other Ambulatory Visit: Payer: Self-pay

## 2023-01-27 DIAGNOSIS — Z79890 Hormone replacement therapy (postmenopausal): Secondary | ICD-10-CM

## 2023-01-27 MED ORDER — PROGESTERONE MICRONIZED 100 MG PO CAPS: 100.0000 mg | ORAL_CAPSULE | Freq: Every day | ORAL | 0 refills | Status: DC

## 2023-01-27 MED ORDER — ESTRADIOL 0.5 MG PO TABS: 0.5000 mg | ORAL_TABLET | Freq: Every day | ORAL | 0 refills | Status: DC

## 2023-01-27 NOTE — Telephone Encounter (Signed)
Med refill request: Estrace/Prometrium Last AEX: 12/31/21 Next AEX: 02/26/23 with Dr. Karma Greaser Last MMG (if hormonal med) 12/13/22 Refill authorized: Please Advise, #90, 3 RF for both

## 2023-02-18 ENCOUNTER — Ambulatory Visit: Payer: 59 | Admitting: Family Medicine

## 2023-02-18 ENCOUNTER — Encounter: Payer: Self-pay | Admitting: Family Medicine

## 2023-02-18 VITALS — BP 118/70 | HR 64 | Temp 98.3°F | Ht 64.0 in | Wt 170.2 lb

## 2023-02-18 DIAGNOSIS — K219 Gastro-esophageal reflux disease without esophagitis: Secondary | ICD-10-CM

## 2023-02-18 DIAGNOSIS — R109 Unspecified abdominal pain: Secondary | ICD-10-CM | POA: Diagnosis not present

## 2023-02-18 DIAGNOSIS — R194 Change in bowel habit: Secondary | ICD-10-CM | POA: Diagnosis not present

## 2023-02-18 LAB — POCT URINALYSIS DIP (PROADVANTAGE DEVICE)
Bilirubin, UA: NEGATIVE
Blood, UA: NEGATIVE
Glucose, UA: NEGATIVE mg/dL
Ketones, POC UA: NEGATIVE mg/dL
Leukocytes, UA: NEGATIVE
Nitrite, UA: NEGATIVE
Protein Ur, POC: NEGATIVE mg/dL
Specific Gravity, Urine: 1.015
Urobilinogen, Ur: 0.2
pH, UA: 7 (ref 5.0–8.0)

## 2023-02-18 NOTE — Progress Notes (Signed)
Chief Complaint  Patient presents with   Abdominal Pain    Abdominal pain, she woke up last with upper abdominal/sharp cramping. Had some loose stools. After eating some grilled chicken had diarrhea, too some imodium and was ok but the rest of the week she had soft to semi soft stools all week. After that was the night she woke up with the abdominal pain. She starting tracking symptoms and thinks it may be food related. She burps a lot after a meal.     8/17 Saturday night, after eating dinner she had liquid diarrhea.  Occurred again later that night.  She took 2 Tums. She also had loose stool on Sunday, then took Imodium. The rest of last week her stools were soft/semi-soft (usually stools are firm). She was having some intermittent abdominal pain.  She woke up at 2 am the middle of the week last week, with sharp pains down the center of her entire stomach. No heartburn or chest pain.  She was able to get back to sleep (went to [facility], voided some, no BM, didn't make a difference, then went back to bed). Pain was diminished the next day.  BM was normal (semi-soft).  This past Friday-- She had pain and burping after breakfast.  (Same breakfast as always, yogurt, blueberries, granola). Had a second bowel movement after eating, which is very unusual for her. Felt a little woozy, nauseated, ongoing burping. Ate soup and salad for lunch.  Chicken tortilla soup, was a little spicy, so didn't eat much of it.  Cottage cheese on her salad. From 2-5 pm that day she experienced burning, cramping, heartburn that afternoon. Ate a [employer] burger that night for dinner (was on the road). This didn't bother her.  Her cramping dissipated after dinner. Had no issues that evening. Felt fine the next day, until the evening.  On Saturday night--glass of wine, salmon, potato/green beans-- Could only eat half of the meal, half of the wine, due to bloating and burping. Took 2 Tums and had Fresca, and symptoms resolved by  bedtime.  Sunday--fine after breakfast.  Developed burping, LH and felt "overly full" after lunch (1/2 [place] sandwich, some chips, watermelon). Felt better after Tums. No issues with dinner that night. No issues yesterday--no heartburn, belching or pain.  Stool this morning was softer than usual, slight abdominal discomfort this morning.  "Doesn't feel great", no indigestion.  Mother had COVID 8/13. She had been with her for >24 hours, significant exposure. She had negative COVID test on 8/15, 16, and 8/18 (<24 hours after her diarrhea started).   She denies h/o GI problems. She reports her baseline is a "stomach of iron" Only occasional heartburn (after avocados)   PMH, PSH, SH reviewed  Outpatient Encounter Medications as of 02/18/2023  Medication Sig Note   Ascorbic Acid (VITAMIN C PO) Take 1 tablet by mouth daily at 6 (six) AM.    B Complex Vitamins (VITAMIN B COMPLEX PO) Take 1 tablet by mouth daily at 6 (six) AM.    estradiol (ESTRACE) 0.5 MG tablet Take 1 tablet (0.5 mg total) by mouth daily.    NONFORMULARY OR COMPOUNDED ITEM Take 1 Scoop by mouth daily. 02/18/2023: JUCE-1 scoop in water daily   progesterone (PROMETRIUM) 100 MG capsule Take 1 capsule (100 mg total) by mouth daily.    VITAMIN D PO Take 1 tablet by mouth daily at 6 (six) AM. 02/18/2023: 2000iu daily   rosuvastatin (CRESTOR) 5 MG tablet Take 1 tablet (5 mg total)  by mouth daily. (Patient not taking: Reported on 02/18/2023)    zolmitriptan (ZOMIG-ZMT) 2.5 MG disintegrating tablet TAKE ONE TABLET BY MOUTH ONCE A DAY AS NEEDED FOR HEADACHE (Patient not taking: Reported on 06/04/2022) 02/18/2023: migraine   [DISCONTINUED] LORazepam (ATIVAN) 1 MG tablet Take one tablet po one hour prior to the procedure.    [DISCONTINUED] nystatin (MYCOSTATIN/NYSTOP) powder Apply to groin with each bathroom use    [DISCONTINUED] nystatin-triamcinolone ointment (MYCOLOG) Apply 1 Application topically 2 (two) times daily. Apply to thighs and  rash twice a day for at least 7 days    No facility-administered encounter medications on file as of 02/18/2023.   No Known Allergies   ROS: no fever, chills, URI symptoms, cough, chest pain, shortness of breath. +stool changes, nausea, belching and abdominal pain per HPI. No urinary complaints. No weight change. See HPI     PHYSICAL EXAM:  BP 118/70   Pulse 64   Temp 98.3 F (36.8 C) (Tympanic)   Ht 5\' 4"  (1.626 m)   Wt 170 lb 3.2 oz (77.2 kg)   LMP 05/24/2012 (Approximate)   BMI 29.21 kg/m   Wt Readings from Last 3 Encounters:  02/18/23 170 lb 3.2 oz (77.2 kg)  06/04/22 170 lb (77.1 kg)  02/14/22 155 lb (70.3 kg)   Pleasant, well-appearing female, in no distress HEENT: conjunctiva and sclera are clear, anicteric, EOMI Neck: no lymphadenopathy, thyromegaly or mass Heart: regular rate and rhythm Lungs: clear bilaterally Abdomen: soft, nontender, no organomegaly or mass.  (Reports epigastrium is where she previously had discomfort, nontender now). Extremities: no edema Skin: normal turgor, no rash Psych: normal mood, affect, hygiene and grooming Neuro: alert and oriented, cranial nerves grossly intact, normal gait.    ASSESSMENT/PLAN:  Change in bowel habits - acute onset of diarrhea after exposure to COVID, suspect COVID (tested too early for + antigen test). Symptoms resolving. Rec probiotics  Gastroesophageal reflux disease without esophagitis - This may be related to recent viral illness/poss COVID.  Reviewed proper diet. Rec trial of PPI or pepcid x 2 weeks  Abdominal pain, unspecified abdominal location - midline and epigastric; pain resolved. - Plan: POCT Urinalysis DIP (Proadvantage Device)  I spent 37 minutes dedicated to the care of this patient, including pre-visit review of records, face to face time, post-visit ordering of testing and documentation.    Take a probiotic daily for a few weeks (ie Align, or others). Take a prilosec OTC course (daily  for 2 weeks), to help get your stomach/esophagus back to normal. If for some reason you can't tolerate prilosec, you can use an over-the-counter pepcid (famotidine) twice daily for 2 weeks. Stick with a bland diet--avoiding "typical" things that trigger reflux, alcohol, caffeine, spicy foods, chocolate, tomatoes, citrus. Once you are feeling better, you can resume your normal diet.

## 2023-02-18 NOTE — Patient Instructions (Signed)
Take a probiotic daily for a few weeks (ie Align, or others). Take a prilosec OTC course (daily for 2 weeks), to help get your stomach/esophagus back to normal. If for some reason you can't tolerate prilosec, you can use an over-the-counter pepcid (famotidine) twice daily for 2 weeks. Stick with a bland diet--avoiding "typical" things that trigger reflux, alcohol, caffeine, spicy foods, chocolate, tomatoes, citrus. Once you are feeling better, you can resume your normal diet.  Since your GI illness may have been COVID (due to your exposure to your mom), you can delay getting your updated COVID booster by a month (we typically say you can wait 3 months, but I would get it at least 2 weeks prior to leaving the country).

## 2023-02-26 ENCOUNTER — Encounter: Payer: Self-pay | Admitting: Obstetrics and Gynecology

## 2023-02-26 ENCOUNTER — Other Ambulatory Visit (HOSPITAL_COMMUNITY)
Admission: RE | Admit: 2023-02-26 | Discharge: 2023-02-26 | Disposition: A | Discharge status: Home / Self Care | Payer: 59 | Source: Ambulatory Visit | Attending: Obstetrics and Gynecology | Admitting: Obstetrics and Gynecology

## 2023-02-26 ENCOUNTER — Ambulatory Visit (INDEPENDENT_AMBULATORY_CARE_PROVIDER_SITE_OTHER): Payer: 59 | Admitting: Obstetrics and Gynecology

## 2023-02-26 VITALS — BP 116/74 | HR 66 | Ht 63.75 in | Wt 169.0 lb

## 2023-02-26 DIAGNOSIS — G43009 Migraine without aura, not intractable, without status migrainosus: Secondary | ICD-10-CM | POA: Diagnosis not present

## 2023-02-26 DIAGNOSIS — Z79890 Hormone replacement therapy (postmenopausal): Secondary | ICD-10-CM | POA: Diagnosis not present

## 2023-02-26 DIAGNOSIS — Z01419 Encounter for gynecological examination (general) (routine) without abnormal findings: Secondary | ICD-10-CM | POA: Diagnosis present

## 2023-02-26 DIAGNOSIS — N898 Other specified noninflammatory disorders of vagina: Secondary | ICD-10-CM | POA: Diagnosis not present

## 2023-02-26 DIAGNOSIS — N951 Menopausal and female climacteric states: Secondary | ICD-10-CM

## 2023-02-26 LAB — WET PREP FOR TRICH, YEAST, CLUE

## 2023-02-26 MED ORDER — ZOLMITRIPTAN 2.5 MG PO TBDP
ORAL_TABLET | ORAL | 4 refills | Status: AC
Start: 2023-02-26 — End: ?

## 2023-02-26 MED ORDER — PROGESTERONE MICRONIZED 100 MG PO CAPS
100.0000 mg | ORAL_CAPSULE | Freq: Every day | ORAL | 6 refills | Status: DC
Start: 2023-02-26 — End: 2024-04-29

## 2023-02-26 MED ORDER — VITAMIN D 50 MCG (2000 UT) PO TABS: 2000.0000 [IU] | ORAL_TABLET | Freq: Every day | ORAL | 5 refills | Status: DC

## 2023-02-26 MED ORDER — ESTRADIOL 0.5 MG PO TABS: 0.5000 mg | ORAL_TABLET | Freq: Every day | ORAL | 6 refills | Status: DC

## 2023-02-26 NOTE — Progress Notes (Signed)
62 y.o. y.o. female here for annual exam. She denies any PM bleeding.   HRT: using estrogen/progesterone daily.  Has uterus and ovaries intact.  She is very happy with the dosing and does not want to change or stop.  Pelvic discharge: has not noticed any  Last mammogram: UTD. To follow in 6 months Last colonoscopy: UTD  Height 5' 3.75" (1.619 m), weight 169 lb (76.7 kg), last menstrual period 05/24/2012.     Component Value Date/Time   DIAGPAP  12/02/2018 0000    NEGATIVE FOR INTRAEPITHELIAL LESIONS OR MALIGNANCY.   ADEQPAP  12/02/2018 0000    Satisfactory for evaluation  endocervical/transformation zone component PRESENT.    GYN HISTORY:    Component Value Date/Time   DIAGPAP  12/02/2018 0000    NEGATIVE FOR INTRAEPITHELIAL LESIONS OR MALIGNANCY.   ADEQPAP  12/02/2018 0000    Satisfactory for evaluation  endocervical/transformation zone component PRESENT.    OB History  Gravida Para Term Preterm AB Living  0 0 0 0 0 0  SAB IAB Ectopic Multiple Live Births  0 0 0 0 0    Past Medical History:  Diagnosis Date   Acute bilateral low back pain without sciatica 03/18/2018   Backache 04/24/2010   Qualifier: Diagnosis of  By: Tawanna Cooler MD, Tinnie Gens A    Migraine without aura since college   more regualr with menses, now in menopause and less frequent   Seasonal allergies    Tachycardia 2000   occasional use of Atenol    Past Surgical History:  Procedure Laterality Date   COLONOSCOPY  2020   JMP-MAC-suprep(good)-TA   LAPAROSCOPIC APPENDECTOMY N/A 01/27/2017   Procedure: APPENDECTOMY LAPAROSCOPIC;  Surgeon: Abigail Miyamoto, MD;  Location: [facility] OR;  Service: General;  Laterality: N/A;   POLYPECTOMY  2020   TA   WISDOM TOOTH EXTRACTION  1982    Current Outpatient Medications on File Prior to Visit  Medication Sig Dispense Refill   Ascorbic Acid (VITAMIN C PO) Take 1 tablet by mouth daily at 6 (six) AM.     B Complex Vitamins (VITAMIN B COMPLEX PO) Take 1 tablet by  mouth daily at 6 (six) AM.     estradiol (ESTRACE) 0.5 MG tablet Take 1 tablet (0.5 mg total) by mouth daily. 90 tablet 0   NONFORMULARY OR COMPOUNDED ITEM Take 1 Scoop by mouth daily.     progesterone (PROMETRIUM) 100 MG capsule Take 1 capsule (100 mg total) by mouth daily. 90 capsule 0   rosuvastatin (CRESTOR) 5 MG tablet Take 1 tablet (5 mg total) by mouth daily. (Patient not taking: Reported on 02/18/2023) 90 tablet 3   VITAMIN D PO Take 1 tablet by mouth daily at 6 (six) AM.     zolmitriptan (ZOMIG-ZMT) 2.5 MG disintegrating tablet TAKE ONE TABLET BY MOUTH ONCE A DAY AS NEEDED FOR HEADACHE (Patient not taking: Reported on 06/04/2022) 6 tablet 2   No current facility-administered medications on file prior to visit.    Social History   Socioeconomic History   Marital status: Married    Spouse name: Not on file   Number of children: 0   Years of education: Not on file   Highest education level: Not on file  Occupational History    Employer: [employer]  Tobacco Use   Smoking status: Never   Smokeless tobacco: Never  Vaping Use   Vaping status: Never Used  Substance and Sexual Activity   Alcohol use: Not Currently    Comment: socially--2  drinks/month   Drug use: No   Sexual activity: Yes    Partners: Male    Birth control/protection: Post-menopausal    Comment: first intercourse >17, 5 or more partners  Other Topics Concern   Not on file  Social History Narrative   Lives with husband.  Works at [employer].   Social Determinants of Corporate investment banker Strain: Not on file  Food Insecurity: Not on file  Transportation Needs: Not on file  Physical Activity: Not on file  Stress: Not on file  Social Connections: Not on file  Intimate Partner Violence: Not on file    Family History  Problem Relation Age of Onset   Colon polyps Mother 46   Obesity Mother    Stroke Mother    Heart disease Mother    Hyperlipidemia Mother    Atrial fibrillation Mother    Hypertension Mother     Osteoarthritis Mother    Alzheimer's disease Mother    Pulmonary embolism Mother    Hypertension Father    Hyperlipidemia Father    Heart disease Father        pace maker   Atrial fibrillation Father    Diabetes Maternal Grandmother    Hypertension Maternal Grandmother    Stroke Maternal Grandmother    Uterine cancer Maternal Grandmother    Heart disease Maternal Grandfather    Cancer Other        uterine   Breast cancer Neg Hx    Colon cancer Neg Hx    Hemochromatosis Neg Hx    Esophageal cancer Neg Hx    Rectal cancer Neg Hx    Stomach cancer Neg Hx      No Known Allergies    Patient's last menstrual period was Patient's last menstrual period was 05/24/2012 (approximate)..          Sexually active: yes, married   Review of Systems Alls systems reviewed and are negative.     PE General appearance: alert, cooperative and appears stated age Head: Normocephalic, without obvious abnormality, atraumatic Neck: no adenopathy, supple, symmetrical, trachea midline and thyroid normal to inspection and palpation Lungs: clear to auscultation bilaterally Breasts: normal appearance, no masses or tenderness Heart: regular rate and rhythm Abdomen: soft, non-tender; bowel sounds normal; no masses,  no organomegaly Extremities: extremities normal, atraumatic, no cyanosis or edema Skin: Skin color, texture, turgor normal. No rashes or lesions Lymph nodes: Cervical, supraclavicular, and axillary nodes normal. No abnormal inguinal nodes palpated Neurologic: Grossly normal     Pelvic: External genitalia:  no lesions              Urethra:  normal appearing urethra with no masses, tenderness or lesions              Bartholins and Skenes: normal                 Vagina: normal appearing vagina with possible yeast or BV, no lesions.               Cervix: no lesions, no cervical motion tenderness              WET MOUNT: completed and negative Bimanual Exam:  Uterus:  normal size,  contour, position, consistency, mobility, non-tender              Adnexa: no mass, fullness, tenderness- however, difficult to appreciate.  To get TV US          Chaperone was present for exam.  A:         Well Woman GYN exam                             P:        Pap smear collected             Encouraged consistent mammogram screening             Labs and immunizations with her primary             Discussed breast self exams             Encouraged safe sexual practices and enouraged healthy lifestyle practices with diet and exercise Traveling to [place] soon. TV [place] to evaluate endometrial lining on HRT use and ovaries Refills sent. Earley Favor

## 2023-02-26 NOTE — Addendum Note (Signed)
Addended by: Earley Favor on: 02/26/2023 10:44 AM   Modules accepted: Orders

## 2023-02-26 NOTE — Patient Instructions (Addendum)
Vit D sent in as well.  Take daily for your bone health along with 500mg  calcium daily. Radiology should call you to schedule the vaginal ultrasound. It was so nice seeing you today. See in annually or q2 years for pap smear screening. Dr. Karma Greaser

## 2023-03-11 ENCOUNTER — Ambulatory Visit (HOSPITAL_BASED_OUTPATIENT_CLINIC_OR_DEPARTMENT_OTHER)
Admission: RE | Admit: 2023-03-11 | Discharge: 2023-03-11 | Disposition: A | Discharge status: Home / Self Care | Payer: 59 | Source: Ambulatory Visit | Attending: Obstetrics and Gynecology | Admitting: Obstetrics and Gynecology

## 2023-03-11 DIAGNOSIS — Z01419 Encounter for gynecological examination (general) (routine) without abnormal findings: Secondary | ICD-10-CM | POA: Insufficient documentation

## 2023-03-11 DIAGNOSIS — Z79890 Hormone replacement therapy (postmenopausal): Secondary | ICD-10-CM | POA: Insufficient documentation

## 2023-03-14 LAB — CYTOLOGY - PAP: Diagnosis: NEGATIVE

## 2023-03-27 ENCOUNTER — Other Ambulatory Visit: Payer: Self-pay

## 2023-03-27 DIAGNOSIS — Z13820 Encounter for screening for osteoporosis: Secondary | ICD-10-CM

## 2023-06-09 ENCOUNTER — Ambulatory Visit (INDEPENDENT_AMBULATORY_CARE_PROVIDER_SITE_OTHER): Payer: 59 | Admitting: Nurse Practitioner

## 2023-06-09 ENCOUNTER — Encounter: Payer: Self-pay | Admitting: Nurse Practitioner

## 2023-06-09 VITALS — BP 128/80 | HR 65 | Wt 176.8 lb

## 2023-06-09 DIAGNOSIS — R102 Pelvic and perineal pain: Secondary | ICD-10-CM | POA: Insufficient documentation

## 2023-06-09 DIAGNOSIS — Z23 Encounter for immunization: Secondary | ICD-10-CM | POA: Diagnosis not present

## 2023-06-09 DIAGNOSIS — G9331 Postviral fatigue syndrome: Secondary | ICD-10-CM | POA: Insufficient documentation

## 2023-06-09 DIAGNOSIS — R1024 Suprapubic pain: Secondary | ICD-10-CM | POA: Insufficient documentation

## 2023-06-09 DIAGNOSIS — I480 Paroxysmal atrial fibrillation: Secondary | ICD-10-CM

## 2023-06-09 DIAGNOSIS — Z0000 Encounter for general adult medical examination without abnormal findings: Secondary | ICD-10-CM | POA: Diagnosis not present

## 2023-06-09 DIAGNOSIS — F4321 Adjustment disorder with depressed mood: Secondary | ICD-10-CM | POA: Insufficient documentation

## 2023-06-09 DIAGNOSIS — M533 Sacrococcygeal disorders, not elsewhere classified: Secondary | ICD-10-CM | POA: Insufficient documentation

## 2023-06-09 DIAGNOSIS — I700 Atherosclerosis of aorta: Secondary | ICD-10-CM | POA: Insufficient documentation

## 2023-06-09 LAB — POCT URINE DIPSTICK
Bilirubin, UA: NEGATIVE
Blood, UA: NEGATIVE
Glucose, UA: NEGATIVE mg/dL
Ketones, POC UA: NEGATIVE mg/dL
Leukocytes, UA: NEGATIVE
Nitrite, UA: NEGATIVE
POC PROTEIN,UA: NEGATIVE
Spec Grav, UA: 1.01 (ref 1.010–1.025)
Urobilinogen, UA: 0.2 U/dL
pH, UA: 7 (ref 5.0–8.0)

## 2023-06-09 LAB — LIPID PANEL

## 2023-06-09 MED ORDER — AMPHETAMINE-DEXTROAMPHETAMINE 10 MG PO TABS
10.0000 mg | ORAL_TABLET | Freq: Two times a day (BID) | ORAL | 0 refills | Status: AC
Start: 2023-06-09 — End: ?

## 2023-06-09 MED ORDER — ATENOLOL 25 MG PO TABS: 25.0000 mg | ORAL_TABLET | Freq: Every day | ORAL | 3 refills | Status: DC | PRN

## 2023-06-09 NOTE — Assessment & Plan Note (Signed)
Will assess urine today to ensure that there are no concerns present. If no findings and labs are normal, consider imaging if symptoms persist.

## 2023-06-09 NOTE — Progress Notes (Signed)
Shawna Clamp, DNP, AGNP-c [facility] [address] [place], [place] [address] Main Office [phone]  BP 128/80   Pulse 65   Wt 176 lb 12.8 oz (80.2 kg)   LMP 05/24/2012 (Approximate)   BMI 30.59 kg/m    Subjective:    Patient ID: Sydney Padilla, female    DOB: 1961/02/27, 62 y.o.   MRN: [id number]  HPI: Sydney Padilla is a 62 y.o. female presenting on 06/09/2023 for comprehensive medical examination.  Angalena Sanor, a 62 year old female, presents for an annual physical exam. She reports a history of gastrointestinal episodes in August and November, and intermittent right lower back pain for the past year. She also mentions recent bereavement, having lost her mother, for whom she was the primary caregiver, in October.  The patient describes a significant period of stress and illness, including suspected COVID-19 infection, during her mother's hospice care. She reports a loss of taste and smell, persistent cough, and an episode of severe diarrhea. She also experienced a burning, cramping sensation in her lower abdomen, which was suspected to be a gastric manifestation of COVID-19. This was managed with a 14-day course of Prilosec and Align, which seemed to resolve the issue.  However, following a trip to [place], the patient experienced another episode of diarrhea and vomiting, which she initially attributed to stress. Upon returning home, she reported increased respiratory symptoms and a significant decrease in energy levels. She also noted a change in her taste for coffee, which she previously consumed in large quantities, describing it as tasting "like dirt."  The patient also reports a history of right-sided lower back pain, which she believes to be muscular in nature due to a previous injury. She has been managing this with massage and has recently returned to the gym to focus on stretching exercises. She also reports nocturnal bladder discomfort and frequent  urination, despite not having significant urine output. She acknowledges that she may be dehydrated due to inadequate fluid intake.  The patient has been experiencing significant fatigue and cognitive changes, including a feeling of "fuzziness" and difficulty with vocabulary. She attributes this to the after-effects of COVID-19 and the stress of bereavement. She also reports weight gain and a decrease in self-esteem, although she does not express significant distress about this.  The patient has a history of atrial fibrillation, which she manages with as-needed atenolol. She notes that excessive caffeine intake can trigger episodes. She also has a history of a mammogram scare, with a follow-up appointment scheduled in the coming weeks. She reports recent breast tenderness, similar to premenstrual symptoms. She is currently on hormone replacement therapy for menopausal symptoms.  In summary, this is a 62 year old individual with a complex recent history of suspected COVID-19 infection, bereavement, and ongoing symptoms of fatigue, cognitive changes, gastrointestinal issues, and lower back pain. She also has a history of atrial fibrillation and is on hormone replacement therapy.  Pertinent items are noted in HPI.  IMMUNIZATIONS:   Flu Vaccine: Flu vaccine completed elsewhere this season. Record updated. Prevnar 13: Prevnar 13 N/A for this patient Prevnar 20: Prevnar 20 N/A for this patient Pneumovax 23: Pneumovax 23 N/A for this patient Vac Shingrix: Shingrix both doses completed, documentation in chart HPV: N/A or Aged Out Tetanus: Tetanus completed in the last 10 years COVID: COVID completed, documentation needed RSV: No  HEALTH MAINTENANCE: Pap Smear HM Status: is up to date Mammogram HM Status: is up to date Colon Cancer Screening HM Status: is up to date Bone  Density HM Status: N/A STI Testing HM Status: was declined  Lung CT HM Status: N/A  Concerns with vision, hearing, or  dentition: No  Most Recent Depression Screen:     06/09/2023    9:18 AM 06/04/2022    9:09 AM 04/03/2021    3:03 PM 01/27/2017   10:16 AM  Depression screen PHQ 2/9  Decreased Interest 0 0 0 0  Down, Depressed, Hopeless 1 0 0 0  PHQ - 2 Score 1 0 0 0   Most Recent Anxiety Screen:      No data to display         Most Recent Fall Screen:    06/09/2023    9:18 AM 02/26/2023    9:10 AM 06/04/2022    9:09 AM 04/03/2021    3:01 PM  Fall Risk   Falls in the past year? 0 0 0 0  Number falls in past yr: 0 0 0 0  Injury with Fall? 0 0 0 0  Risk for fall due to : No Fall Risks No Fall Risks No Fall Risks No Fall Risks  Follow up Falls evaluation completed Falls evaluation completed Falls evaluation completed Falls evaluation completed;Falls prevention discussed    Past medical history, surgical history, medications, allergies, family history and social history reviewed with patient today and changes made to appropriate areas of the chart.  Past Medical History:  Past Medical History:  Diagnosis Date   Acute bilateral low back pain without sciatica 03/18/2018   Backache 04/24/2010   Qualifier: Diagnosis of  By: Tawanna Cooler MD, Eugenio Hoes    Basal cell carcinoma    skin times 2   Caregiver stress 04/03/2021   Family history of cardiac disorder 04/03/2021   Migraine without aura since college   more regualr with menses, now in menopause and less frequent   Seasonal allergies    Tachycardia 2000   occasional use of Atenol   Medications:  Current Outpatient Medications on File Prior to Visit  Medication Sig   Ascorbic Acid (VITAMIN C PO) Take 1 tablet by mouth daily at 6 (six) AM.   B Complex Vitamins (VITAMIN B COMPLEX PO) Take 1 tablet by mouth daily at 6 (six) AM.   Cholecalciferol (VITAMIN D) 50 MCG (2000 UT) tablet Take 1 tablet (2,000 Units total) by mouth daily.   estradiol (ESTRACE) 0.5 MG tablet Take 1 tablet (0.5 mg total) by mouth daily.   loratadine (CLARITIN) 10 MG tablet  Take 10 mg by mouth daily.   NONFORMULARY OR COMPOUNDED ITEM Take 1 Scoop by mouth daily.   progesterone (PROMETRIUM) 100 MG capsule Take 1 capsule (100 mg total) by mouth daily.   VITAMIN D PO Take 1 tablet by mouth daily at 6 (six) AM.   zolmitriptan (ZOMIG-ZMT) 2.5 MG disintegrating tablet TAKE ONE TABLET BY MOUTH ONCE A DAY AS NEEDED FOR HEADACHE   aspirin EC 81 MG tablet Take 81 mg by mouth daily.   No current facility-administered medications on file prior to visit.   Surgical History:  Past Surgical History:  Procedure Laterality Date   COLONOSCOPY  2020   JMP-MAC-suprep(good)-TA   LAPAROSCOPIC APPENDECTOMY N/A 01/27/2017   Procedure: APPENDECTOMY LAPAROSCOPIC;  Surgeon: Abigail Miyamoto, MD;  Location: [facility] OR;  Service: General;  Laterality: N/A;   POLYPECTOMY  2020   TA   WISDOM TOOTH EXTRACTION  1982   Allergies:  No Known Allergies Family History:  Family History  Problem Relation Age of Onset   Colon  polyps Mother 92   Obesity Mother    Stroke Mother    Heart disease Mother    Hyperlipidemia Mother    Atrial fibrillation Mother    Hypertension Mother    Osteoarthritis Mother    Pulmonary embolism Mother    Dementia Mother    Hypertension Father    Hyperlipidemia Father    Heart disease Father        pace maker   Atrial fibrillation Father    Diabetes Maternal Grandmother    Hypertension Maternal Grandmother    Stroke Maternal Grandmother    Uterine cancer Maternal Grandmother    Heart disease Maternal Grandfather    Cancer Other        uterine       Objective:    BP 128/80   Pulse 65   Wt 176 lb 12.8 oz (80.2 kg)   LMP 05/24/2012 (Approximate)   BMI 30.59 kg/m   Wt Readings from Last 3 Encounters:  06/09/23 176 lb 12.8 oz (80.2 kg)  02/26/23 169 lb (76.7 kg)  02/18/23 170 lb 3.2 oz (77.2 kg)    Physical Exam Vitals and nursing note reviewed.  Constitutional:      General: She is not in acute distress.    Appearance: Normal appearance.   HENT:     Head: Normocephalic and atraumatic.     Right Ear: Hearing, tympanic membrane, ear canal and external ear normal.     Left Ear: Hearing, tympanic membrane, ear canal and external ear normal.     Nose: Nose normal.     Right Sinus: No maxillary sinus tenderness or frontal sinus tenderness.     Left Sinus: No maxillary sinus tenderness or frontal sinus tenderness.     Mouth/Throat:     Lips: Pink.     Mouth: Mucous membranes are moist.     Pharynx: Oropharynx is clear.  Eyes:     General: Lids are normal. Vision grossly intact.     Extraocular Movements: Extraocular movements intact.     Conjunctiva/sclera: Conjunctivae normal.     Pupils: Pupils are equal, round, and reactive to light.     Funduscopic exam:    Right eye: Red reflex present.        Left eye: Red reflex present.    Visual Fields: Right eye visual fields normal and left eye visual fields normal.  Neck:     Thyroid: No thyromegaly.     Vascular: No carotid bruit.  Cardiovascular:     Rate and Rhythm: Normal rate and regular rhythm.     Chest Wall: PMI is not displaced.     Pulses: Normal pulses.          Dorsalis pedis pulses are 2+ on the right side and 2+ on the left side.       Posterior tibial pulses are 2+ on the right side and 2+ on the left side.     Heart sounds: Normal heart sounds. No murmur heard. Pulmonary:     Effort: Pulmonary effort is normal. No respiratory distress.     Breath sounds: Normal breath sounds.  Abdominal:     General: Abdomen is flat. Bowel sounds are normal. There is no distension.     Palpations: Abdomen is soft. There is no hepatomegaly, splenomegaly or mass.     Tenderness: There is no abdominal tenderness. There is no right CVA tenderness, left CVA tenderness, guarding or rebound.  Musculoskeletal:        General:  Normal range of motion.     Cervical back: Full passive range of motion without pain, normal range of motion and neck supple. No tenderness.     Right lower  leg: No edema.     Left lower leg: No edema.     Comments: Tenderness noted over the right SI joint. No erythema or notable changes detected on exam.   Feet:     Left foot:     Toenail Condition: Left toenails are normal.  Lymphadenopathy:     Cervical: No cervical adenopathy.     Upper Body:     Right upper body: No supraclavicular adenopathy.     Left upper body: No supraclavicular adenopathy.  Skin:    General: Skin is warm and dry.     Capillary Refill: Capillary refill takes less than 2 seconds.     Nails: There is no clubbing.  Neurological:     General: No focal deficit present.     Mental Status: She is alert and oriented to person, place, and time.     GCS: GCS eye subscore is 4. GCS verbal subscore is 5. GCS motor subscore is 6.     Sensory: Sensation is intact.     Motor: Motor function is intact.     Coordination: Coordination is intact.     Gait: Gait is intact.     Deep Tendon Reflexes: Reflexes are normal and symmetric.  Psychiatric:        Attention and Perception: Attention normal.        Mood and Affect: Mood normal. Affect is tearful.        Speech: Speech normal.        Behavior: Behavior normal. Behavior is cooperative.        Thought Content: Thought content normal.        Cognition and Memory: Cognition and memory normal.        Judgment: Judgment normal.      Results for orders placed or performed in visit on 06/09/23  CBC with Differential/Platelet   Collection Time: 06/09/23 10:22 AM  Result Value Ref Range   WBC 5.2 3.4 - 10.8 x10E3/uL   RBC 4.42 3.77 - 5.28 x10E6/uL   Hemoglobin 13.1 11.1 - 15.9 g/dL   Hematocrit 66.4 40.3 - 46.6 %   MCV 91 79 - 97 fL   MCH 29.6 26.6 - 33.0 pg   MCHC 32.4 31.5 - 35.7 g/dL   RDW 47.4 25.9 - 56.3 %   Platelets 256 150 - 450 x10E3/uL   Neutrophils 54 Not Estab. %   Lymphs 35 Not Estab. %   Monocytes 7 Not Estab. %   Eos 3 Not Estab. %   Basos 1 Not Estab. %   Neutrophils Absolute 2.8 1.4 - 7.0 x10E3/uL    Lymphocytes Absolute 1.8 0.7 - 3.1 x10E3/uL   Monocytes Absolute 0.4 0.1 - 0.9 x10E3/uL   EOS (ABSOLUTE) 0.2 0.0 - 0.4 x10E3/uL   Basophils Absolute 0.0 0.0 - 0.2 x10E3/uL   Immature Granulocytes 0 Not Estab. %   Immature Grans (Abs) 0.0 0.0 - 0.1 x10E3/uL  CMP14+EGFR   Collection Time: 06/09/23 10:22 AM  Result Value Ref Range   Glucose 94 70 - 99 mg/dL   BUN 15 8 - 27 mg/dL   Creatinine, Ser 8.75 0.57 - 1.00 mg/dL   eGFR 92 >64 PP/IRJ/1.88   BUN/Creatinine Ratio 20 12 - 28   Sodium 143 134 - 144 mmol/L   Potassium 4.8  3.5 - 5.2 mmol/L   Chloride 105 96 - 106 mmol/L   CO2 22 20 - 29 mmol/L   Calcium 9.4 8.7 - 10.3 mg/dL   Total Protein 6.9 6.0 - 8.5 g/dL   Albumin 4.5 3.9 - 4.9 g/dL   Globulin, Total 2.4 1.5 - 4.5 g/dL   Bilirubin Total 0.2 0.0 - 1.2 mg/dL   Alkaline Phosphatase 87 44 - 121 IU/L   AST 14 0 - 40 IU/L   ALT 17 0 - 32 IU/L  Hemoglobin A1c   Collection Time: 06/09/23 10:22 AM  Result Value Ref Range   Hgb A1c MFr Bld 5.5 4.8 - 5.6 %   Est. average glucose Bld gHb Est-mCnc 111 mg/dL  Lipid panel   Collection Time: 06/09/23 10:22 AM  Result Value Ref Range   Cholesterol, Total 215 (H) 100 - 199 mg/dL   Triglycerides 79 0 - 149 mg/dL   HDL 90 >16 mg/dL   VLDL Cholesterol Cal 14 5 - 40 mg/dL   LDL Chol Calc (NIH) 109 (H) 0 - 99 mg/dL   Chol/HDL Ratio 2.4 0.0 - 4.4 ratio  Vitamin B12   Collection Time: 06/09/23 10:22 AM  Result Value Ref Range   Vitamin B-12 752 232 - 1,245 pg/mL  VITAMIN D 25 Hydroxy (Vit-D Deficiency, Fractures)   Collection Time: 06/09/23 10:22 AM  Result Value Ref Range   Vit D, 25-Hydroxy 32.9 30.0 - 100.0 ng/mL  POCT URINE DIPSTICK   Collection Time: 06/09/23  3:50 PM  Result Value Ref Range   Color, UA yellow yellow   Clarity, UA clear clear   Glucose, UA negative negative mg/dL   Bilirubin, UA negative negative   Ketones, POC UA negative negative mg/dL   Spec Grav, UA 6.045 4.098 - 1.025   Blood, UA negative negative   pH, UA  7.0 5.0 - 8.0   POC PROTEIN,UA negative negative, trace   Urobilinogen, UA 0.2 0.2 or 1.0 E.U./dL   Nitrite, UA Negative Negative   Leukocytes, UA Negative Negative       Assessment & Plan:   Problem List Items Addressed This Visit     Encounter for annual physical exam   CPE completed today. Review of HM activities and recommendations discussed and provided on AVS. Anticipatory guidance, diet, and exercise recommendations provided. Medications, allergies, and hx reviewed and updated as necessary. Orders placed as listed below.  Plan: - Labs ordered. Will make changes as necessary based on results.  - I will review these results and send recommendations via MyChart or a telephone call.  - F/U with CPE in 1 year or sooner for acute/chronic health needs as directed.        Relevant Orders   CBC with Differential/Platelet (Completed)   CMP14+EGFR (Completed)   Hemoglobin A1c (Completed)   Lipid panel (Completed)   Vitamin B12 (Completed)   VITAMIN D 25 Hydroxy (Vit-D Deficiency, Fractures) (Completed)   Grief - Primary   Grief and depressive symptoms following the recent death of her mother. Symptoms include sadness, lack of energy, and cognitive difficulties. Discussed the natural grieving process and potential pharmacological interventions if symptoms persist.   - Continue seeing a grief counselor   - Monitor mood and cognitive function   - Consider pharmacological intervention if symptoms persist        Postviral fatigue syndrome   Persistent brain fog, fatigue, and lack of energy post-COVID-19, exacerbated by grief and stress. Discussed potential benefits and risks of stimulant medication  for cognitive improvement, focus, and energy.   - Prescribe Adderall 10 mg in the morning with an optional 10 mg in the afternoon   - Monitor for side effects such as increased heart rate, anxiety, or insomnia   - Consider atenolol 25 mg as needed for potential AFib exacerbation and stop  Adderall immediately if this occurs.       Relevant Medications   amphetamine-dextroamphetamine (ADDERALL) 10 MG tablet   Other Relevant Orders   CBC with Differential/Platelet (Completed)   CMP14+EGFR (Completed)   Hemoglobin A1c (Completed)   Lipid panel (Completed)   Vitamin B12 (Completed)   VITAMIN D 25 Hydroxy (Vit-D Deficiency, Fractures) (Completed)   Suprapubic pressure   Will assess urine today to ensure that there are no concerns present. If no findings and labs are normal, consider imaging if symptoms persist.      Relevant Orders   POCT URINE DIPSTICK (Completed)   Sacroiliac joint dysfunction of right side   Chronic right-sided lower back pain, likely musculoskeletal, possibly from previous muscle strain due to caregiving. Pain localized to the sacroiliac joint. Discussed non-pharmacological interventions and potential benefits of physical therapy.   - Recommend stretching exercises   - Advise use of ibuprofen for inflammation   - Suggest heat or ice application   - Consider lidocaine patches for pain relief        Aortic atherosclerosis (HCC)   Relevant Medications   aspirin EC 81 MG tablet   atenolol (TENORMIN) 25 MG tablet   Need for shingles vaccine   Other Visit Diagnoses       Paroxysmal A-fib (HCC)       Relevant Medications   aspirin EC 81 MG tablet   atenolol (TENORMIN) 25 MG tablet         Follow up plan: Return in about 3 months (around 09/07/2023) for Med Management 30- [facility]- Mood and Focus.  NEXT PREVENTATIVE PHYSICAL DUE IN 1 YEAR.  PATIENT COUNSELING PROVIDED FOR ALL ADULT PATIENTS: A well balanced diet low in saturated fats, cholesterol, and moderation in carbohydrates.  This can be as simple as monitoring portion sizes and cutting back on sugary beverages such as soda and juice to start with.    Daily water consumption of at least 64 ounces.  Physical activity at least 180 minutes per week.  If just starting out, start 10  minutes a day and work your way up.   This can be as simple as taking the stairs instead of the elevator and walking 2-3 laps around the office  purposefully every day.   STD protection, partner selection, and regular testing if high risk.  Limited consumption of alcoholic beverages if alcohol is consumed. For men, I recommend no more than 14 alcoholic beverages per week, spread out throughout the week (max 2 per day). Avoid "binge" drinking or consuming large quantities of alcohol in one setting.  Please let me know if you feel you may need help with reduction or quitting alcohol consumption.   Avoidance of nicotine, if used. Please let me know if you feel you may need help with reduction or quitting nicotine use.   Daily mental health attention. This can be in the form of 5 minute daily meditation, prayer, journaling, yoga, reflection, etc.  Purposeful attention to your emotions and mental state can significantly improve your overall wellbeing  and  Health.  Please know that I am here to help you with all of your health care goals and am happy  to work with you to find a solution that works best for you.  The greatest advice I have received with any changes in life are to take it one step at a time, that even means if all you can focus on is the next 60 seconds, then do that and celebrate your victories.  With any changes in life, you will have set backs, and that is OK. The important thing to remember is, if you have a set back, it is not a failure, it is an opportunity to try again! Screening Testing Mammogram Every 1 -2 years based on history and risk factors Starting at age 27 Pap Smear Ages 21-39 every 3 years Ages 3-65 every 5 years with HPV testing More frequent testing may be required based on results and history Colon Cancer Screening Every 1-10 years based on test performed, risk factors, and history Starting at age 13 Bone Density Screening Every 2-10 years based on  history Starting at age 44 for women Recommendations for men differ based on medication usage, history, and risk factors AAA Screening One time ultrasound Men 53-72 years old who have every smoked Lung Cancer Screening Low Dose Lung CT every 12 months Age 64-80 years with a 30 pack-year smoking history who still smoke or who have quit within the last 15 years   Screening Labs Routine  Labs: Complete Blood Count (CBC), Complete Metabolic Panel (CMP), Cholesterol (Lipid Panel) Every 6-12 months based on history and medications May be recommended more frequently based on current conditions or previous results Hemoglobin A1c Lab Every 3-12 months based on history and previous results Starting at age 57 or earlier with diagnosis of diabetes, high cholesterol, BMI >26, and/or risk factors Frequent monitoring for patients with diabetes to ensure blood sugar control Thyroid Panel (TSH) Every 6 months based on history, symptoms, and risk factors May be repeated more often if on medication HIV One time testing for all patients 29 and older May be repeated more frequently for patients with increased risk factors or exposure Hepatitis C One time testing for all patients 18 and older May be repeated more frequently for patients with increased risk factors or exposure Gonorrhea, Chlamydia Every 12 months for all sexually active persons 13-24 years Additional monitoring may be recommended for those who are considered high risk or who have symptoms Every 12 months for any woman on birth control, regardless of sexual activity PSA Men 7-68 years old with risk factors Additional screening may be recommended from age 61-69 based on risk factors, symptoms, and history  Vaccine Recommendations Tetanus Booster All adults every 10 years Flu Vaccine All patients 6 months and older every year COVID Vaccine All patients 12 years and older Initial dosing with booster May recommend additional booster  based on age and health history HPV Vaccine 2 doses all patients age 42-26 Dosing may be considered for patients over 26 Shingles Vaccine (Shingrix) 2 doses all adults 55 years and older Pneumonia (Pneumovax 109) All adults 65 years and older May recommend earlier dosing based on health history One year apart from Prevnar 84 Pneumonia (Prevnar 19) All adults 65 years and older Dosed 1 year after Pneumovax 23 Pneumonia (Prevnar 20) One time alternative to the two dosing of 13 and 23 For all adults with initial dose of 23, 20 is recommended 1 year later For all adults with initial dose of 13, 23 is still recommended as second option 1 year later

## 2023-06-09 NOTE — Assessment & Plan Note (Signed)
Chronic right-sided lower back pain, likely musculoskeletal, possibly from previous muscle strain due to caregiving. Pain localized to the sacroiliac joint. Discussed non-pharmacological interventions and potential benefits of physical therapy.   - Recommend stretching exercises   - Advise use of ibuprofen for inflammation   - Suggest heat or ice application   - Consider lidocaine patches for pain relief

## 2023-06-09 NOTE — Assessment & Plan Note (Signed)
Persistent brain fog, fatigue, and lack of energy post-COVID-19, exacerbated by grief and stress. Discussed potential benefits and risks of stimulant medication for cognitive improvement, focus, and energy.   - Prescribe Adderall 10 mg in the morning with an optional 10 mg in the afternoon   - Monitor for side effects such as increased heart rate, anxiety, or insomnia   - Consider atenolol 25 mg as needed for potential AFib exacerbation and stop Adderall immediately if this occurs.

## 2023-06-09 NOTE — Assessment & Plan Note (Signed)

## 2023-06-09 NOTE — Patient Instructions (Signed)
Try ice ,heat, stretches, and lidocaine patches on the area of pain to help with the inflammation and pain. If this doesn't seem to help let me know and we can always do a referral for joint injection or orthopedics, if you like.   Sacroiliac Joint Dysfunction  Sacroiliac joint dysfunction is a condition that causes inflammation on one or both sides of the sacroiliac (SI) joint. The SI joint is the joint between two bones of the pelvis called the sacrum and the ilium. The sacrum is the bone at the base of the spine. The ilium is the large bone that forms the hip. This condition causes deep aching or burning pain in the low back. In some cases, the pain may also spread into one or both buttocks, hips, or thighs. What are the causes? This condition may be caused by: Pregnancy. During pregnancy, extra stress is put on the SI joints because the pelvis widens. Injury, such as: Injuries from car crashes. Sports-related injuries. Work-related injuries. Having one leg that is shorter than the other. Conditions that affect the joints, such as: Rheumatoid arthritis. Gout. Psoriatic arthritis. Joint infection (septic arthritis). Sometimes, the cause of SI joint dysfunction is not known. What are the signs or symptoms? Symptoms of this condition include: Aching or burning pain in the lower back. The pain may also spread to other areas, such as: Buttocks. Groin. Thighs. Muscle spasms in or around the painful areas. Increased pain when standing, walking, running, stair climbing, bending, or lifting. How is this diagnosed? This condition is diagnosed with a physical exam and your medical history. During the exam, the health care provider may move one or both of your legs to different positions to check for pain. Various tests may be done to confirm the diagnosis, including: Imaging tests to look for other causes of pain. These may include: MRI. CT scan. Bone scan. Diagnostic injection. A numbing  medicine is injected into the SI joint using a needle. If your pain is temporarily improved or stopped after the injection, this can indicate that SI joint dysfunction is the problem. How is this treated? Treatment depends on the cause and severity of your condition. Treatment options can be noninvasive and may include: Ice or heat applied to the lower back area after an injury. This may help reduce pain and muscle spasms. Medicines to relieve pain or inflammation or to relax the muscles. Wearing a back brace (sacroiliac brace) to help support the joint while your back is healing. Physical therapy to increase muscle strength around the joint and flexibility at the joint. This may also involve learning proper body positions and ways of moving to relieve stress on the joint. Direct manipulation of the SI joint. Use of a device that provides electrical stimulation to help reduce pain at the joint. Other treatments may include: Injections of steroid medicine into the joint to reduce pain and swelling. Radiofrequency ablation. This treatment uses heat to burn away nerves that are carrying pain messages from the joint. Surgery to put in screws and plates that limit or prevent joint motion. This is rare. Follow these instructions at home: Medicines Take over-the-counter and prescription medicines only as told by your health care provider. Ask your health care provider if the medicine prescribed to you: Requires you to avoid driving or using machinery. Can cause constipation. You may need to take these actions to prevent or treat constipation: Drink enough fluid to keep your urine pale yellow. Take over-the-counter or prescription medicines. Eat foods that  are high in fiber, such as beans, whole grains, and fresh fruits and vegetables. Limit foods that are high in fat and processed sugars, such as fried or sweet foods. If you have a brace: Wear the brace as told by your health care provider. Remove  it only as told by your health care provider. Keep the brace clean. If the brace is not waterproof: Do not let it get wet. Cover it with a watertight covering when you take a bath or a shower. Managing pain, stiffness, and swelling     Icing can help with pain and swelling. Heat may help with muscle tension or spasms. Ask your health care provider if you should use ice or heat. If directed, put ice on the affected area: If you have a removable brace, remove it as told by your health care provider. Put ice in a plastic bag. Place a towel between your skin and the bag. Leave the ice on for 20 minutes, 2-3 times a day. Remove the ice if your skin turns bright red. This is very important. If you cannot feel pain, heat, or cold, you have a greater risk of damage to the area. If directed, apply heat to the affected area as often as told by your health care provider. Use the heat source that your health care provider recommends, such as a moist heat pack or a heating pad. Place a towel between your skin and the heat source. Leave the heat on for 20-30 minutes. Remove the heat if your skin turns bright red. This is especially important if you are unable to feel pain, heat, or cold. You may have a greater risk of getting burned. General instructions Rest as needed. Return to your normal activities as told by your health care provider. Ask your health care provider what activities are safe for you. Do exercises as told by your health care provider or physical therapist. Keep all follow-up visits. This is important. Contact a health care provider if: Your pain is not controlled with medicine. You have a fever. Your pain is getting worse. Get help right away if: You have weakness, numbness, or tingling in your legs or feet. You lose control of your bladder or bowels. Summary Sacroiliac (SI) joint dysfunction is a condition that causes inflammation on one or both sides of the SI joint. This  condition causes deep aching or burning pain in the low back. In some cases, the pain may also spread into one or both buttocks, hips, or thighs. Treatment depends on the cause and severity of your condition. It may include medicines to reduce pain and swelling or to relax muscles. This information is not intended to replace advice given to you by your health care provider. Make sure you discuss any questions you have with your health care provider. Document Revised: 10/21/2019 Document Reviewed: 10/21/2019 Elsevier Patient Education  2024 [employer].

## 2023-06-09 NOTE — Assessment & Plan Note (Signed)
Grief and depressive symptoms following the recent death of her mother. Symptoms include sadness, lack of energy, and cognitive difficulties. Discussed the natural grieving process and potential pharmacological interventions if symptoms persist.   - Continue seeing a grief counselor   - Monitor mood and cognitive function   - Consider pharmacological intervention if symptoms persist

## 2023-06-10 LAB — CBC WITH DIFFERENTIAL/PLATELET
Basophils Absolute: 0 10*3/uL (ref 0.0–0.2)
Basos: 1 %
EOS (ABSOLUTE): 0.2 10*3/uL (ref 0.0–0.4)
Eos: 3 %
Hematocrit: 40.4 % (ref 34.0–46.6)
Hemoglobin: 13.1 g/dL (ref 11.1–15.9)
Immature Grans (Abs): 0 10*3/uL (ref 0.0–0.1)
Immature Granulocytes: 0 %
Lymphocytes Absolute: 1.8 10*3/uL (ref 0.7–3.1)
Lymphs: 35 %
MCH: 29.6 pg (ref 26.6–33.0)
MCHC: 32.4 g/dL (ref 31.5–35.7)
MCV: 91 fL (ref 79–97)
Monocytes Absolute: 0.4 10*3/uL (ref 0.1–0.9)
Monocytes: 7 %
Neutrophils Absolute: 2.8 10*3/uL (ref 1.4–7.0)
Neutrophils: 54 %
Platelets: 256 10*3/uL (ref 150–450)
RBC: 4.42 x10E6/uL (ref 3.77–5.28)
RDW: 12.6 % (ref 11.7–15.4)
WBC: 5.2 10*3/uL (ref 3.4–10.8)

## 2023-06-10 LAB — CMP14+EGFR
ALT: 17 IU/L (ref 0–32)
AST: 14 IU/L (ref 0–40)
Albumin: 4.5 g/dL (ref 3.9–4.9)
Alkaline Phosphatase: 87 IU/L (ref 44–121)
BUN/Creatinine Ratio: 20 (ref 12–28)
BUN: 15 mg/dL (ref 8–27)
Bilirubin Total: 0.2 mg/dL (ref 0.0–1.2)
CO2: 22 mmol/L (ref 20–29)
Calcium: 9.4 mg/dL (ref 8.7–10.3)
Chloride: 105 mmol/L (ref 96–106)
Creatinine, Ser: 0.74 mg/dL (ref 0.57–1.00)
Globulin, Total: 2.4 g/dL (ref 1.5–4.5)
Glucose: 94 mg/dL (ref 70–99)
Potassium: 4.8 mmol/L (ref 3.5–5.2)
Sodium: 143 mmol/L (ref 134–144)
Total Protein: 6.9 g/dL (ref 6.0–8.5)
eGFR: 92 mL/min/{1.73_m2} (ref >59)

## 2023-06-10 LAB — LIPID PANEL
Cholesterol, Total: 215 mg/dL — ABNORMAL HIGH (ref 100–199)
HDL: 90 mg/dL (ref >39)
LDL CALC COMMENT:: 2.4 ratio (ref 0.0–4.4)
LDL Chol Calc (NIH): 111 mg/dL — ABNORMAL HIGH (ref 0–99)
Triglycerides: 79 mg/dL (ref 0–149)
VLDL Cholesterol Cal: 14 mg/dL (ref 5–40)

## 2023-06-10 LAB — VITAMIN D 25 HYDROXY (VIT D DEFICIENCY, FRACTURES): Vit D, 25-Hydroxy: 32.9 ng/mL (ref 30.0–100.0)

## 2023-06-10 LAB — VITAMIN B12: Vitamin B-12: 752 pg/mL (ref 232–1245)

## 2023-06-10 LAB — HEMOGLOBIN A1C
Est. average glucose Bld gHb Est-mCnc: 111 mg/dL
Hgb A1c MFr Bld: 5.5 % (ref 4.8–5.6)

## 2023-06-30 ENCOUNTER — Ambulatory Visit
Admission: RE | Admit: 2023-06-30 | Discharge: 2023-06-30 | Disposition: A | Discharge status: Home / Self Care | Payer: 59 | Source: Ambulatory Visit | Attending: Nurse Practitioner | Admitting: Nurse Practitioner

## 2023-06-30 DIAGNOSIS — N6489 Other specified disorders of breast: Secondary | ICD-10-CM

## 2023-12-01 ENCOUNTER — Other Ambulatory Visit: Payer: Self-pay | Admitting: Nurse Practitioner

## 2023-12-01 DIAGNOSIS — N6489 Other specified disorders of breast: Secondary | ICD-10-CM

## 2023-12-03 ENCOUNTER — Telehealth: Payer: Self-pay | Admitting: Obstetrics and Gynecology

## 2023-12-03 DIAGNOSIS — E2839 Other primary ovarian failure: Secondary | ICD-10-CM

## 2023-12-03 NOTE — Telephone Encounter (Signed)
 Oct dxa not scheduled. Sent message with new referral Dr. Tia Flowers

## 2024-01-01 ENCOUNTER — Ambulatory Visit
Admission: RE | Admit: 2024-01-01 | Discharge: 2024-01-01 | Disposition: A | Discharge status: Home / Self Care | Source: Ambulatory Visit | Attending: Nurse Practitioner | Admitting: Nurse Practitioner

## 2024-01-01 DIAGNOSIS — N6489 Other specified disorders of breast: Secondary | ICD-10-CM

## 2024-03-03 ENCOUNTER — Ambulatory Visit: Payer: 59 | Admitting: Obstetrics and Gynecology

## 2024-04-26 ENCOUNTER — Other Ambulatory Visit (HOSPITAL_BASED_OUTPATIENT_CLINIC_OR_DEPARTMENT_OTHER): Payer: Self-pay

## 2024-04-26 MED ORDER — FLUZONE 0.5 ML IM SUSY
0.5000 mL | PREFILLED_SYRINGE | Freq: Once | INTRAMUSCULAR | 0 refills | Status: AC
  Filled 2024-04-26: qty 0.5, 1d supply, fill #0

## 2024-04-29 ENCOUNTER — Ambulatory Visit (INDEPENDENT_AMBULATORY_CARE_PROVIDER_SITE_OTHER): Admitting: Obstetrics and Gynecology

## 2024-04-29 ENCOUNTER — Encounter: Payer: Self-pay | Admitting: Obstetrics and Gynecology

## 2024-04-29 VITALS — BP 118/72 | HR 100 | Ht 63.78 in | Wt 178.6 lb

## 2024-04-29 DIAGNOSIS — Z01419 Encounter for gynecological examination (general) (routine) without abnormal findings: Secondary | ICD-10-CM

## 2024-04-29 DIAGNOSIS — Z79890 Hormone replacement therapy (postmenopausal): Secondary | ICD-10-CM

## 2024-04-29 DIAGNOSIS — E2839 Other primary ovarian failure: Secondary | ICD-10-CM | POA: Diagnosis not present

## 2024-04-29 DIAGNOSIS — N951 Menopausal and female climacteric states: Secondary | ICD-10-CM | POA: Diagnosis not present

## 2024-04-29 MED ORDER — PROGESTERONE MICRONIZED 100 MG PO CAPS
100.0000 mg | ORAL_CAPSULE | Freq: Every day | ORAL | 6 refills | Status: AC
Start: 2024-04-29 — End: ?

## 2024-04-29 MED ORDER — ESTRADIOL 0.5 MG PO TABS
0.5000 mg | ORAL_TABLET | Freq: Every day | ORAL | 6 refills | Status: AC
Start: 2024-04-29 — End: ?

## 2024-04-29 NOTE — Progress Notes (Signed)
 63 y.o. y.o. female here for annual exam. Patient's last menstrual period was 05/24/2012 (approximate).   No PMB Pelvic discharge: denies Pelvic pain: denies On HRT and pleased with it. Estrace  0.5 every day and prometrium  100mg  QHS Has done cardiac calcium  score  Last mammogram: 01/01/24 Last colonoscopy: 10/26/21 Has dermatologist she sees annually  Body mass index is 30.87 kg/m.     04/29/2024    9:49 AM 06/09/2023    9:18 AM 06/04/2022    9:09 AM  Depression screen PHQ 2/9  Decreased Interest 0 0 0  Down, Depressed, Hopeless 0 1 0  PHQ - 2 Score 0 1 0    Blood pressure 118/72, pulse 100, height 5' 3.78 (1.62 m), weight 178 lb 9.6 oz (81 kg), last menstrual period 05/24/2012, SpO2 98%.     Component Value Date/Time   DIAGPAP  02/26/2023 1044    - Negative for intraepithelial lesion or malignancy (NILM)   DIAGPAP  12/02/2018 0000    NEGATIVE FOR INTRAEPITHELIAL LESIONS OR MALIGNANCY.   ADEQPAP  02/26/2023 1044    Satisfactory for evaluation; transformation zone component PRESENT.   ADEQPAP  12/02/2018 0000    Satisfactory for evaluation  endocervical/transformation zone component PRESENT.    GYN HISTORY:    Component Value Date/Time   DIAGPAP  02/26/2023 1044    - Negative for intraepithelial lesion or malignancy (NILM)   DIAGPAP  12/02/2018 0000    NEGATIVE FOR INTRAEPITHELIAL LESIONS OR MALIGNANCY.   ADEQPAP  02/26/2023 1044    Satisfactory for evaluation; transformation zone component PRESENT.   ADEQPAP  12/02/2018 0000    Satisfactory for evaluation  endocervical/transformation zone component PRESENT.    OB History  Gravida Para Term Preterm AB Living  0 0 0 0 0 0  SAB IAB Ectopic Multiple Live Births  0 0 0 0 0    Past Medical History:  Diagnosis Date   Acute bilateral low back pain without sciatica 03/18/2018   Backache 04/24/2010   Qualifier: Diagnosis of  By: Krystal MD, Reyes A    Basal cell carcinoma    skin times 2   Caregiver stress  04/03/2021   Family history of cardiac disorder 04/03/2021   Migraine without aura since college   more regualr with menses, now in menopause and less frequent   Seasonal allergies    Tachycardia 2000   occasional use of Atenol    Past Surgical History:  Procedure Laterality Date   COLONOSCOPY  2020   JMP-MAC-suprep(good)-TA   LAPAROSCOPIC APPENDECTOMY N/A 01/27/2017   Procedure: APPENDECTOMY LAPAROSCOPIC;  Surgeon: Vernetta Berg, MD;  Location: [facility] OR;  Service: General;  Laterality: N/A;   POLYPECTOMY  2020   TA   WISDOM TOOTH EXTRACTION  1982    Current Outpatient Medications on File Prior to Visit  Medication Sig Dispense Refill   Ascorbic Acid (VITAMIN C PO) Take 1 tablet by mouth daily at 6 (six) AM.     atenolol  (TENORMIN ) 25 MG tablet Take 1 tablet (25 mg total) by mouth daily as needed. For tachycardia or afib breakthrough 30 tablet 3   B Complex Vitamins (VITAMIN B COMPLEX PO) Take 1 tablet by mouth daily at 6 (six) AM.     Cholecalciferol (VITAMIN D ) 50 MCG (2000 UT) tablet Take 1 tablet (2,000 Units total) by mouth daily. 90 tablet 5   estradiol  (ESTRACE ) 0.5 MG tablet Take 1 tablet (0.5 mg total) by mouth daily. 90 tablet 6   loratadine (CLARITIN) 10  MG tablet Take 10 mg by mouth daily.     progesterone  (PROMETRIUM ) 100 MG capsule Take 1 capsule (100 mg total) by mouth daily. 90 capsule 6   VITAMIN D  PO Take 1 tablet by mouth daily at 6 (six) AM.     amphetamine -dextroamphetamine  (ADDERALL) 10 MG tablet Take 1 tablet (10 mg total) by mouth 2 (two) times daily. Do not take second dose later than 2pm. (Patient not taking: Reported on 04/29/2024) 60 tablet 0   aspirin  EC 81 MG tablet Take 81 mg by mouth daily. (Patient not taking: Reported on 04/29/2024)     NONFORMULARY OR COMPOUNDED ITEM Take 1 Scoop by mouth daily. (Patient not taking: Reported on 04/29/2024)     zolmitriptan  (ZOMIG -ZMT) 2.5 MG disintegrating tablet TAKE ONE TABLET BY MOUTH ONCE A DAY AS NEEDED FOR  HEADACHE 6 tablet 4   No current facility-administered medications on file prior to visit.    Social History   Socioeconomic History   Marital status: Married    Spouse name: Not on file   Number of children: 0   Years of education: Not on file   Highest education level: Not on file  Occupational History    Employer: [employer]  Tobacco Use   Smoking status: Never   Smokeless tobacco: Never  Vaping Use   Vaping status: Never Used  Substance and Sexual Activity   Alcohol use: Yes    Comment: socially--2 drinks/month   Drug use: No   Sexual activity: Yes    Partners: Male    Birth control/protection: Post-menopausal    Comment: first intercourse >17, 5 or more partners  Other Topics Concern   Not on file  Social History Narrative   Lives with husband.  Works at [employer].   Social Drivers of Corporate Investment Banker Strain: Not on file  Food Insecurity: Not on file  Transportation Needs: Not on file  Physical Activity: Not on file  Stress: Not on file  Social Connections: Not on file  Intimate Partner Violence: Not on file    Family History  Problem Relation Age of Onset   Colon polyps Mother 36   Obesity Mother    Stroke Mother    Heart disease Mother    Hyperlipidemia Mother    Atrial fibrillation Mother    Hypertension Mother    Osteoarthritis Mother    Pulmonary embolism Mother    Dementia Mother    Hypertension Father    Hyperlipidemia Father    Heart disease Father        pace maker   Atrial fibrillation Father    Diabetes Maternal Grandmother    Hypertension Maternal Grandmother    Stroke Maternal Grandmother    Uterine cancer Maternal Grandmother    Heart disease Maternal Grandfather    Cancer Other        uterine     No Known Allergies    Patient's last menstrual period was Patient's last menstrual period was 05/24/2012 (approximate)..             Review of Systems Alls systems reviewed and are negative.     Physical  Exam Constitutional:      Appearance: Normal appearance.  Genitourinary:     Vulva and urethral meatus normal.     No lesions in the vagina.     Right Labia: No rash, lesions or skin changes.    Left Labia: No lesions, skin changes or rash.    No vaginal discharge or  tenderness.     No vaginal prolapse present.    No vaginal atrophy present.     Right Adnexa: not tender, not palpable and no mass present.    Left Adnexa: not tender, not palpable and no mass present.    No cervical motion tenderness or discharge.     Uterus is not enlarged, tender or irregular.  Breasts:    Right: Normal.     Left: Normal.  HENT:     Head: Normocephalic.  Neck:     Thyroid : No thyroid  mass, thyromegaly or thyroid  tenderness.  Cardiovascular:     Rate and Rhythm: Normal rate and regular rhythm.     Heart sounds: Normal heart sounds, S1 normal and S2 normal.  Pulmonary:     Effort: Pulmonary effort is normal.     Breath sounds: Normal breath sounds and air entry.  Abdominal:     General: There is no distension.     Palpations: Abdomen is soft. There is no mass.     Tenderness: There is no abdominal tenderness. There is no guarding or rebound.  Musculoskeletal:        General: Normal range of motion.     Cervical back: Full passive range of motion without pain, normal range of motion and neck supple. No tenderness.     Right lower leg: No edema.     Left lower leg: No edema.  Neurological:     Mental Status: She is alert.  Skin:    General: Skin is warm.  Psychiatric:        Mood and Affect: Mood normal.        Behavior: Behavior normal.        Thought Content: Thought content normal.  Vitals and nursing note reviewed. Exam conducted with a chaperone present.       A:         Well Woman GYN exam                             P:        Pap smear not indicated Encouraged annual mammogram screening Colon cancer screening up-to-date DXA ordered today Labs and immunizations to do with  PMD Discussed breast self exams Encouraged healthy lifestyle practices Encouraged Vit D and Calcium    No follow-ups on file.  Sydney Padilla

## 2024-04-29 NOTE — Patient Instructions (Signed)
 Locations you can schedule your bone scan are:  The [facility] bone scan location scheduling line is [phone]  [facility] is [phone]  [facility] radiology [phone]   [facility] [phone]  [facility]: [phone]    Another option if they are behind is [facility] and their number is [phone].  I can send the referral if you want to go there.    Please let me know if you have any trouble scheduling. This is important for management of osteoporosis or osteopenia.   I can sit down with you after the scan to discuss results and treatment.  Dr. Glennon

## 2024-05-06 ENCOUNTER — Ambulatory Visit (HOSPITAL_BASED_OUTPATIENT_CLINIC_OR_DEPARTMENT_OTHER)
Admission: RE | Admit: 2024-05-06 | Discharge: 2024-05-06 | Disposition: A | Discharge status: Home / Self Care | Source: Ambulatory Visit | Attending: Obstetrics and Gynecology | Admitting: Obstetrics and Gynecology

## 2024-05-06 DIAGNOSIS — E2839 Other primary ovarian failure: Secondary | ICD-10-CM | POA: Diagnosis present

## 2024-05-06 DIAGNOSIS — Z01419 Encounter for gynecological examination (general) (routine) without abnormal findings: Secondary | ICD-10-CM | POA: Insufficient documentation

## 2024-05-07 ENCOUNTER — Ambulatory Visit: Payer: Self-pay | Admitting: Obstetrics and Gynecology

## 2024-05-07 ENCOUNTER — Encounter: Payer: Self-pay | Admitting: Obstetrics and Gynecology

## 2024-06-10 ENCOUNTER — Ambulatory Visit (INDEPENDENT_AMBULATORY_CARE_PROVIDER_SITE_OTHER): Admitting: Nurse Practitioner

## 2024-06-10 ENCOUNTER — Encounter: Payer: Self-pay | Admitting: Nurse Practitioner

## 2024-06-10 VITALS — BP 112/74 | HR 68 | Ht 64.0 in | Wt 178.2 lb

## 2024-06-10 DIAGNOSIS — D225 Melanocytic nevi of trunk: Secondary | ICD-10-CM | POA: Insufficient documentation

## 2024-06-10 DIAGNOSIS — Z130 Encounter for screening for diseases of the blood and blood-forming organs and certain disorders involving the immune mechanism: Secondary | ICD-10-CM

## 2024-06-10 DIAGNOSIS — Z0000 Encounter for general adult medical examination without abnormal findings: Secondary | ICD-10-CM | POA: Diagnosis not present

## 2024-06-10 DIAGNOSIS — H3321 Serous retinal detachment, right eye: Secondary | ICD-10-CM

## 2024-06-10 DIAGNOSIS — Z1321 Encounter for screening for nutritional disorder: Secondary | ICD-10-CM

## 2024-06-10 DIAGNOSIS — M8589 Other specified disorders of bone density and structure, multiple sites: Secondary | ICD-10-CM | POA: Diagnosis not present

## 2024-06-10 DIAGNOSIS — M79674 Pain in right toe(s): Secondary | ICD-10-CM

## 2024-06-10 DIAGNOSIS — I480 Paroxysmal atrial fibrillation: Secondary | ICD-10-CM | POA: Diagnosis not present

## 2024-06-10 DIAGNOSIS — Z1329 Encounter for screening for other suspected endocrine disorder: Secondary | ICD-10-CM

## 2024-06-10 DIAGNOSIS — H66001 Acute suppurative otitis media without spontaneous rupture of ear drum, right ear: Secondary | ICD-10-CM | POA: Diagnosis not present

## 2024-06-10 DIAGNOSIS — L821 Other seborrheic keratosis: Secondary | ICD-10-CM | POA: Insufficient documentation

## 2024-06-10 DIAGNOSIS — L719 Rosacea, unspecified: Secondary | ICD-10-CM | POA: Insufficient documentation

## 2024-06-10 DIAGNOSIS — C44229 Squamous cell carcinoma of skin of left ear and external auricular canal: Secondary | ICD-10-CM | POA: Insufficient documentation

## 2024-06-10 DIAGNOSIS — Z13228 Encounter for screening for other metabolic disorders: Secondary | ICD-10-CM | POA: Diagnosis not present

## 2024-06-10 DIAGNOSIS — Z85828 Personal history of other malignant neoplasm of skin: Secondary | ICD-10-CM | POA: Insufficient documentation

## 2024-06-10 LAB — LP+LDL DIRECT

## 2024-06-10 MED ORDER — AZITHROMYCIN 250 MG PO TABS: ORAL_TABLET | ORAL | 0 refills | Status: AC

## 2024-06-10 MED ORDER — FLUTICASONE PROPIONATE 50 MCG/ACT NA SUSP: 2.0000 | Freq: Every day | NASAL | 6 refills | Status: AC

## 2024-06-10 MED ORDER — ATENOLOL 25 MG PO TABS: 25.0000 mg | ORAL_TABLET | Freq: Every day | ORAL | 3 refills | Status: AC | PRN

## 2024-06-10 NOTE — Assessment & Plan Note (Signed)
°  Orders:   CMP14+EGFR   VITAMIN D  25 Hydroxy (Vit-D Deficiency, Fractures)   TSH   T4, free

## 2024-06-10 NOTE — Patient Instructions (Addendum)
 I recommend a supplement of calcium  1200mg  a day along with vitamin D3 2000-4000IU a day.   Try the fat pad cushion and if this is not helpful for your foot pain, let me know and I am happy to send a referral to the orthopedist.   Try switching to zyrtec for a couple of weeks to help with the ear pressure and fullness. I did send in azithromycin  for the infection that appears to be brewing. I also sent in flonase  to start using to help with this.     For all adult patients, I recommend A well balanced diet low in saturated fats, cholesterol, and moderation in carbohydrates.   This can be as simple as monitoring portion sizes and cutting back on sugary beverages such as soda and juice to start with.    Daily water consumption of at least 64 ounces.  Physical activity at least 180 minutes per week, if just starting out.   This can be as simple as taking the stairs instead of the elevator and walking 2-3 laps around the office  purposefully every day.   STD protection, partner selection, and regular testing if high risk.  Limited consumption of alcoholic beverages if alcohol is consumed.  For women, I recommend no more than 7 alcoholic beverages per week, spread out throughout the week.  Avoid binge drinking or consuming large quantities of alcohol in one setting.   Please let me know if you feel you may need help with reduction or quitting alcohol consumption.   Avoidance of nicotine, if used.  Please let me know if you feel you may need help with reduction or quitting nicotine use.   Daily mental health attention.  This can be in the form of 5 minute daily meditation, prayer, journaling, yoga, reflection, etc.   Purposeful attention to your emotions and mental state can significantly improve your overall wellbeing  and  Health.  Please know that I am here to help you with all of your health care goals and am happy to work with you to find a solution that works best for you.  The  greatest advice I have received with any changes in life are to take it one step at a time, that even means if all you can focus on is the next 60 seconds, then do that and celebrate your victories.  With any changes in life, you will have set backs, and that is OK. The important thing to remember is, if you have a set back, it is not a failure, it is an opportunity to try again!  Health Maintenance Recommendations Screening Testing Mammogram Every 1 -2 years based on history and risk factors Starting at age 37 Pap Smear Ages 21-39 every 3 years Ages 39-65 every 5 years with HPV testing More frequent testing may be required based on results and history Colon Cancer Screening Every 1-10 years based on test performed, risk factors, and history Starting at age 63 Bone Density Screening Every 2-10 years based on history Starting at age 11 for women Recommendations for men differ based on medication usage, history, and risk factors AAA Screening One time ultrasound Men 55-8 years old who have every smoked Lung Cancer Screening Low Dose Lung CT every 12 months Age 12-80 years with a 30 pack-year smoking history who still smoke or who have quit within the last 15 years  Screening Labs Routine  Labs: Complete Blood Count (CBC), Complete Metabolic Panel (CMP), Cholesterol (Lipid Panel) Every 6-12  months based on history and medications May be recommended more frequently based on current conditions or previous results Hemoglobin A1c Lab Every 3-12 months based on history and previous results Starting at age 3 or earlier with diagnosis of diabetes, high cholesterol, BMI >26, and/or risk factors Frequent monitoring for patients with diabetes to ensure blood sugar control Thyroid  Panel (TSH w/ T3 & T4) Every 6 months based on history, symptoms, and risk factors May be repeated more often if on medication HIV One time testing for all patients 25 and older May be repeated more frequently  for patients with increased risk factors or exposure Hepatitis C One time testing for all patients 53 and older May be repeated more frequently for patients with increased risk factors or exposure Gonorrhea, Chlamydia Every 12 months for all sexually active persons 13-24 years Additional monitoring may be recommended for those who are considered high risk or who have symptoms PSA Men 67-52 years old with risk factors Additional screening may be recommended from age 75-69 based on risk factors, symptoms, and history  Vaccine Recommendations Tetanus Booster All adults every 10 years Flu Vaccine All patients 6 months and older every year COVID Vaccine All patients 12 years and older Initial dosing with booster May recommend additional booster based on age and health history HPV Vaccine 2 doses all patients age 66-26 Dosing may be considered for patients over 26 Shingles Vaccine (Shingrix) 2 doses all adults 55 years and older Pneumonia (Pneumovax 23) All adults 65 years and older May recommend earlier dosing based on health history Pneumonia (Prevnar 2) All adults 65 years and older Dosed 1 year after Pneumovax 23  Additional Screening, Testing, and Vaccinations may be recommended on an individualized basis based on family history, health history, risk factors, and/or exposure.

## 2024-06-10 NOTE — Assessment & Plan Note (Signed)
 SABRA

## 2024-06-10 NOTE — Progress Notes (Unsigned)
 {SEHM (Optional):34217}  Sydney Doing, DNP, AGNP-c [facility] [address] [place], [place] [address] Main Office [phone] VISIT TYPE: CPE on 06/10/2024 Today's Vitals   06/10/24 1342  BP: 112/74  Pulse: 68  Weight: 178 lb 3.2 oz (80.8 kg)  Height: 5' 4 (1.626 m)   Body mass index is 30.59 kg/m.  Wt Readings from Last 3 Encounters:  06/10/24 178 lb 3.2 oz (80.8 kg)  04/29/24 178 lb 9.6 oz (81 kg)  06/09/23 176 lb 12.8 oz (80.2 kg)     Subjective:  Annual Exam (Annual exam, patient is nonfasting-had GYN exam 11/25. Having retinal surgery in Jan 12 (r eye). Ears having been popping a lot. Right foot pain (ball of her foot) x 1 year. DEXA done and she has osteopenia, never went over results w/GYN-can you explain to her. )   *** Pertinent items are noted in HPI.     06/10/2024    1:49 PM 04/29/2024    9:49 AM 06/09/2023    9:18 AM 06/04/2022    9:09 AM 04/03/2021    3:03 PM  Depression screen PHQ 2/9  Decreased Interest 0 0 0 0 0  Down, Depressed, Hopeless 0 0 1 0 0  PHQ - 2 Score 0 0 1 0 0        No data to display             06/10/2024    1:48 PM 04/29/2024    9:49 AM 06/09/2023    9:18 AM 02/26/2023    9:10 AM 06/04/2022    9:09 AM  Fall Risk   Falls in the past year? 0 0 0 0 0  Number falls in past yr: 0 0 0 0 0  Injury with Fall? 0 0  0  0  0   Risk for fall due to : No Fall Risks No Fall Risks No Fall Risks No Fall Risks No Fall Risks  Follow up Falls evaluation completed Falls evaluation completed Falls evaluation completed Falls evaluation completed Falls evaluation completed      Data saved with a previous flowsheet row definition   Past medical history, surgical history, medications, allergies, family history and social history reviewed with patient today and changes made to appropriate areas of the chart.      Objective:    Physical Exam Vitals and nursing note reviewed.  Constitutional:      General: She is not  in acute distress.    Appearance: Normal appearance.  HENT:     Head: Normocephalic and atraumatic.     Right Ear: Hearing, ear canal and external ear normal. A middle ear effusion is present.     Left Ear: Hearing, ear canal and external ear normal. A middle ear effusion is present.     Ears:     Comments: Right effusion cloudy. TM slightly erythematous.     Nose: Nose normal.     Right Sinus: No maxillary sinus tenderness or frontal sinus tenderness.     Left Sinus: No maxillary sinus tenderness or frontal sinus tenderness.     Mouth/Throat:     Lips: Pink.     Mouth: Mucous membranes are moist.     Pharynx: Oropharynx is clear.  Eyes:     General: Lids are normal. Vision grossly intact.     Extraocular Movements: Extraocular movements intact.     Conjunctiva/sclera: Conjunctivae normal.     Pupils: Pupils are equal, round, and reactive to light.  Funduscopic exam:    Right eye: Red reflex present.        Left eye: Red reflex present.    Visual Fields: Right eye visual fields normal and left eye visual fields normal.  Neck:     Thyroid : No thyromegaly.     Vascular: No carotid bruit.  Cardiovascular:     Rate and Rhythm: Normal rate and regular rhythm.     Chest Wall: PMI is not displaced.     Pulses: Normal pulses.          Dorsalis pedis pulses are 2+ on the right side and 2+ on the left side.       Posterior tibial pulses are 2+ on the right side and 2+ on the left side.     Heart sounds: Normal heart sounds. No murmur heard. Pulmonary:     Effort: Pulmonary effort is normal. No respiratory distress.     Breath sounds: Normal breath sounds.  Abdominal:     General: Abdomen is flat. Bowel sounds are normal. There is no distension.     Palpations: Abdomen is soft. There is no hepatomegaly, splenomegaly or mass.     Tenderness: There is no abdominal tenderness. There is no right CVA tenderness, left CVA tenderness, guarding or rebound.  Musculoskeletal:        General:  Normal range of motion.     Cervical back: Full passive range of motion without pain, normal range of motion and neck supple. No tenderness.     Right lower leg: No edema.     Left lower leg: No edema.       Feet:  Feet:     Left foot:     Toenail Condition: Left toenails are normal.  Lymphadenopathy:     Cervical: No cervical adenopathy.     Upper Body:     Right upper body: No supraclavicular adenopathy.     Left upper body: No supraclavicular adenopathy.  Skin:    General: Skin is warm and dry.     Capillary Refill: Capillary refill takes less than 2 seconds.     Nails: There is no clubbing.  Neurological:     General: No focal deficit present.     Mental Status: She is alert and oriented to person, place, and time.     GCS: GCS eye subscore is 4. GCS verbal subscore is 5. GCS motor subscore is 6.     Sensory: Sensation is intact. No sensory deficit.     Motor: Motor function is intact. No weakness.     Coordination: Coordination is intact. Coordination normal.     Gait: Gait is intact.     Deep Tendon Reflexes: Reflexes are normal and symmetric.  Psychiatric:        Attention and Perception: Attention normal.        Mood and Affect: Mood normal.        Speech: Speech normal.        Behavior: Behavior normal. Behavior is cooperative.        Thought Content: Thought content normal.        Cognition and Memory: Cognition and memory normal.        Judgment: Judgment normal.         Assessment & Plan:   Assessment & Plan Encounter for annual physical exam     Osteopenia of multiple sites  Orders:   CMP14+EGFR   VITAMIN D  25 Hydroxy (Vit-D Deficiency, Fractures)   TSH  T4, free  Non-recurrent acute suppurative otitis media of right ear without spontaneous rupture of tympanic membrane  Orders:   CBC with Differential/Platelet   azithromycin  (ZITHROMAX ) 250 MG tablet; Take 2 tablets on day 1, then 1 tablet daily on days 2 through 5   fluticasone   (FLONASE ) 50 MCG/ACT nasal spray; Place 2 sprays into both nostrils daily.  Great toe pain, right     Screening for endocrine, nutritional, metabolic and immunity disorder  Orders:   CBC with Differential/Platelet   CMP14+EGFR   LP+LDL Direct   Hemoglobin A1c  Paroxysmal A-fib (HCC)  Orders:   atenolol  (TENORMIN ) 25 MG tablet; Take 1 tablet (25 mg total) by mouth daily as needed. For tachycardia or afib breakthrough  NEXT PREVENTATIVE PHYSICAL DUE IN 1 YEAR.    PATIENT COUNSELING PROVIDED FOR ALL ADULT PATIENTS: A well balanced diet low in saturated fats, cholesterol, and moderation in carbohydrates.  This can be as simple as monitoring portion sizes and cutting back on sugary beverages such as soda and juice to start with.    Daily water consumption of at least 64 ounces.  Physical activity at least 180 minutes per week.  If just starting out, start 10 minutes a day and work your way up.   This can be as simple as taking the stairs instead of the elevator and walking 2-3 laps around the office  purposefully every day.   STD protection, partner selection, and regular testing if high risk.  Limited consumption of alcoholic beverages if alcohol is consumed. For men, I recommend no more than 14 alcoholic beverages per week, spread out throughout the week (max 2 per day). Avoid binge drinking or consuming large quantities of alcohol in one setting.  Please let me know if you feel you may need help with reduction or quitting alcohol consumption.   Avoidance of nicotine, if used. Please let me know if you feel you may need help with reduction or quitting nicotine use.   Daily mental health attention. This can be in the form of 5 minute daily meditation, prayer, journaling, yoga, reflection, etc.  Purposeful attention to your emotions and mental state can significantly improve your overall wellbeing  and  Health.  Please know that I am here to help you with all of your  health care goals and am happy to work with you to find a solution that works best for you.  The greatest advice I have received with any changes in life are to take it one step at a time, that even means if all you can focus on is the next 60 seconds, then do that and celebrate your victories.  With any changes in life, you will have set backs, and that is OK. The important thing to remember is, if you have a set back, it is not a failure, it is an opportunity to try again! Screening Testing Mammogram Every 1 -2 years based on history and risk factors Starting at age 59 Pap Smear Ages 21-39 every 3 years Ages 20-65 every 5 years with HPV testing More frequent testing may be required based on results and history Colon Cancer Screening Every 1-10 years based on test performed, risk factors, and history Starting at age 30 Bone Density Screening Every 2-10 years based on history Starting at age 30 for women Recommendations for men differ based on medication usage, history, and risk factors AAA Screening One time ultrasound Men 41-63 years old who have every smoked Lung Cancer Screening Low  Dose Lung CT every 12 months Age 33-80 years with a 30 pack-year smoking history who still smoke or who have quit within the last 15 years   Screening Labs Routine  Labs: Complete Blood Count (CBC), Complete Metabolic Panel (CMP), Cholesterol (Lipid Panel) Every 6-12 months based on history and medications May be recommended more frequently based on current conditions or previous results Hemoglobin A1c Lab Every 3-12 months based on history and previous results Starting at age 75 or earlier with diagnosis of diabetes, high cholesterol, BMI >26, and/or risk factors Frequent monitoring for patients with diabetes to ensure blood sugar control Thyroid  Panel (TSH) Every 6 months based on history, symptoms, and risk factors May be repeated more often if on medication HIV One time testing for all  patients 56 and older May be repeated more frequently for patients with increased risk factors or exposure Hepatitis C One time testing for all patients 34 and older May be repeated more frequently for patients with increased risk factors or exposure Gonorrhea, Chlamydia Every 12 months for all sexually active persons 13-24 years Additional monitoring may be recommended for those who are considered high risk or who have symptoms Every 12 months for any woman on birth control, regardless of sexual activity PSA Men 62-37 years old with risk factors Additional screening may be recommended from age 45-69 based on risk factors, symptoms, and history  Vaccine Recommendations Tetanus Booster All adults every 10 years Flu Vaccine All patients 6 months and older every year COVID Vaccine All patients 12 years and older Initial dosing with booster May recommend additional booster based on age and health history HPV Vaccine 2 doses all patients age 52-26 Dosing may be considered for patients over 26 Shingles Vaccine (Shingrix) 2 doses all adults 55 years and older Pneumonia (Pneumovax 23) All adults 65 years and older May recommend earlier dosing based on health history One year apart from Prevnar 13 Pneumonia (Prevnar 33) All adults 65 years and older Dosed 1 year after Pneumovax 23 Pneumonia (Prevnar 20) One time alternative to the two dosing of 13 and 23 For all adults with initial dose of 23, 20 is recommended 1 year later For all adults with initial dose of 13, 23 is still recommended as second option 1 year later

## 2024-06-11 LAB — CBC WITH DIFFERENTIAL/PLATELET
Basophils Absolute: 0 x10E3/uL (ref 0.0–0.2)
Basos: 1 %
EOS (ABSOLUTE): 0.1 x10E3/uL (ref 0.0–0.4)
Eos: 2 %
Hematocrit: 42.4 % (ref 34.0–46.6)
Hemoglobin: 13.9 g/dL (ref 11.1–15.9)
Immature Grans (Abs): 0 x10E3/uL (ref 0.0–0.1)
Immature Granulocytes: 0 %
Lymphocytes Absolute: 1.9 x10E3/uL (ref 0.7–3.1)
Lymphs: 30 %
MCH: 30.8 pg (ref 26.6–33.0)
MCHC: 32.8 g/dL (ref 31.5–35.7)
MCV: 94 fL (ref 79–97)
Monocytes Absolute: 0.5 x10E3/uL (ref 0.1–0.9)
Monocytes: 7 %
Neutrophils Absolute: 3.9 x10E3/uL (ref 1.4–7.0)
Neutrophils: 60 %
Platelets: 249 x10E3/uL (ref 150–450)
RBC: 4.52 x10E6/uL (ref 3.77–5.28)
RDW: 12.6 % (ref 11.7–15.4)
WBC: 6.5 x10E3/uL (ref 3.4–10.8)

## 2024-06-11 LAB — TSH: TSH: 1.04 u[IU]/mL (ref 0.450–4.500)

## 2024-06-11 LAB — CMP14+EGFR
ALT: 14 IU/L (ref 0–32)
AST: 17 IU/L (ref 0–40)
Albumin: 4.9 g/dL (ref 3.9–4.9)
Alkaline Phosphatase: 87 IU/L (ref 49–135)
BUN/Creatinine Ratio: 22 (ref 12–28)
BUN: 19 mg/dL (ref 8–27)
Bilirubin Total: 0.3 mg/dL (ref 0.0–1.2)
CO2: 22 mmol/L (ref 20–29)
Calcium: 10.3 mg/dL (ref 8.7–10.3)
Chloride: 100 mmol/L (ref 96–106)
Creatinine, Ser: 0.87 mg/dL (ref 0.57–1.00)
Globulin, Total: 2.5 g/dL (ref 1.5–4.5)
Glucose: 89 mg/dL (ref 70–99)
Potassium: 4.6 mmol/L (ref 3.5–5.2)
Sodium: 139 mmol/L (ref 134–144)
Total Protein: 7.4 g/dL (ref 6.0–8.5)
eGFR: 75 mL/min/1.73

## 2024-06-11 LAB — LP+LDL DIRECT
Cholesterol, Total: 220 mg/dL — AB (ref 100–199)
HDL: 102 mg/dL
LDL Chol Calc (NIH): 105 mg/dL — AB (ref 0–99)
LDL Direct: 120 mg/dL — AB (ref 0–99)
Triglycerides: 75 mg/dL (ref 0–149)
VLDL Cholesterol Cal: 13 mg/dL (ref 5–40)

## 2024-06-11 LAB — HEMOGLOBIN A1C
Est. average glucose Bld gHb Est-mCnc: 108 mg/dL
Hgb A1c MFr Bld: 5.4 % (ref 4.8–5.6)

## 2024-06-11 LAB — T4, FREE: Free T4: 1.03 ng/dL (ref 0.82–1.77)

## 2024-06-11 LAB — VITAMIN D 25 HYDROXY (VIT D DEFICIENCY, FRACTURES): Vit D, 25-Hydroxy: 33.8 ng/mL (ref 30.0–100.0)

## 2024-06-14 ENCOUNTER — Encounter: Payer: Self-pay | Admitting: Nurse Practitioner

## 2024-06-16 ENCOUNTER — Ambulatory Visit: Payer: Self-pay | Admitting: Nurse Practitioner

## 2025-06-13 ENCOUNTER — Encounter: Admitting: Nurse Practitioner
